# Patient Record
Sex: Female | Born: 1950 | Race: White | Hispanic: No | Marital: Single | Smoking: Former smoker
Health system: Southern US, Community
[De-identification: ages and names within clinical notes are randomized; demographics above are authoritative.]

## PROBLEM LIST (undated history)

## (undated) DIAGNOSIS — Z122 Encounter for screening for malignant neoplasm of respiratory organs: Secondary | ICD-10-CM

## (undated) DIAGNOSIS — E7800 Pure hypercholesterolemia, unspecified: Secondary | ICD-10-CM

## (undated) DIAGNOSIS — F32A Depression, unspecified: Secondary | ICD-10-CM

## (undated) DIAGNOSIS — N870 Mild cervical dysplasia: Secondary | ICD-10-CM

## (undated) DIAGNOSIS — D069 Carcinoma in situ of cervix, unspecified: Secondary | ICD-10-CM

## (undated) DIAGNOSIS — M810 Age-related osteoporosis without current pathological fracture: Secondary | ICD-10-CM

## (undated) DIAGNOSIS — J45909 Unspecified asthma, uncomplicated: Secondary | ICD-10-CM

## (undated) DIAGNOSIS — F329 Major depressive disorder, single episode, unspecified: Secondary | ICD-10-CM

## (undated) DIAGNOSIS — S022XXA Fracture of nasal bones, initial encounter for closed fracture: Secondary | ICD-10-CM

## (undated) DIAGNOSIS — R51 Headache: Secondary | ICD-10-CM

## (undated) HISTORY — PX: GYNECOLOGIC CRYOSURGERY: SHX857

## (undated) HISTORY — DX: Major depressive disorder, single episode, unspecified: F32.9

## (undated) HISTORY — PX: NOSE SURGERY: SHX723

## (undated) HISTORY — DX: Depression, unspecified: F32.A

## (undated) HISTORY — DX: Encounter for screening for malignant neoplasm of respiratory organs: Z12.2

## (undated) HISTORY — PX: COLPOSCOPY: SHX161

## (undated) HISTORY — DX: Carcinoma in situ of cervix, unspecified: D06.9

## (undated) HISTORY — DX: Headache: R51

## (undated) HISTORY — PX: KNEE SURGERY: SHX244

## (undated) HISTORY — PX: CERVICAL BIOPSY  W/ LOOP ELECTRODE EXCISION: SUR135

## (undated) HISTORY — DX: Pure hypercholesterolemia, unspecified: E78.00

## (undated) HISTORY — DX: Mild cervical dysplasia: N87.0

## (undated) HISTORY — PX: BACK SURGERY: SHX140

## (undated) HISTORY — DX: Age-related osteoporosis without current pathological fracture: M81.0

## (undated) HISTORY — PX: OTHER SURGICAL HISTORY: SHX169

---

## 1999-08-25 ENCOUNTER — Encounter: Payer: Self-pay | Admitting: Emergency Medicine

## 1999-08-25 ENCOUNTER — Emergency Department (HOSPITAL_COMMUNITY): Admission: EM | Admit: 1999-08-25 | Discharge: 1999-08-25 | Payer: Self-pay | Admitting: Emergency Medicine

## 2002-02-09 ENCOUNTER — Other Ambulatory Visit: Admission: RE | Admit: 2002-02-09 | Discharge: 2002-02-09 | Payer: Self-pay | Admitting: Obstetrics and Gynecology

## 2002-05-16 ENCOUNTER — Encounter: Payer: Self-pay | Admitting: Family Medicine

## 2003-04-21 ENCOUNTER — Other Ambulatory Visit: Admission: RE | Admit: 2003-04-21 | Discharge: 2003-04-21 | Payer: Self-pay | Admitting: Obstetrics and Gynecology

## 2003-08-01 ENCOUNTER — Emergency Department (HOSPITAL_COMMUNITY): Admission: EM | Admit: 2003-08-01 | Discharge: 2003-08-01 | Payer: Self-pay

## 2003-08-16 ENCOUNTER — Encounter: Payer: Self-pay | Admitting: Family Medicine

## 2004-07-10 ENCOUNTER — Other Ambulatory Visit: Admission: RE | Admit: 2004-07-10 | Discharge: 2004-07-10 | Payer: Self-pay | Admitting: Obstetrics and Gynecology

## 2005-03-04 ENCOUNTER — Ambulatory Visit: Payer: Self-pay | Admitting: Family Medicine

## 2005-03-21 ENCOUNTER — Ambulatory Visit: Payer: Self-pay | Admitting: Family Medicine

## 2005-07-14 ENCOUNTER — Other Ambulatory Visit: Admission: RE | Admit: 2005-07-14 | Discharge: 2005-07-14 | Payer: Self-pay | Admitting: Obstetrics and Gynecology

## 2005-12-19 ENCOUNTER — Emergency Department (HOSPITAL_COMMUNITY): Admission: EM | Admit: 2005-12-19 | Discharge: 2005-12-19 | Payer: Self-pay | Admitting: Emergency Medicine

## 2005-12-19 ENCOUNTER — Ambulatory Visit: Payer: Self-pay | Admitting: Family Medicine

## 2005-12-22 ENCOUNTER — Encounter (HOSPITAL_COMMUNITY): Admission: RE | Admit: 2005-12-22 | Discharge: 2006-03-22 | Payer: Self-pay | Admitting: Family Medicine

## 2005-12-26 ENCOUNTER — Emergency Department (HOSPITAL_COMMUNITY): Admission: EM | Admit: 2005-12-26 | Discharge: 2005-12-26 | Payer: Self-pay | Admitting: Emergency Medicine

## 2006-03-30 ENCOUNTER — Ambulatory Visit: Payer: Self-pay | Admitting: Family Medicine

## 2006-06-04 ENCOUNTER — Ambulatory Visit: Payer: Self-pay | Admitting: Family Medicine

## 2006-08-03 ENCOUNTER — Ambulatory Visit: Payer: Self-pay | Admitting: Family Medicine

## 2006-09-10 ENCOUNTER — Other Ambulatory Visit: Admission: RE | Admit: 2006-09-10 | Discharge: 2006-09-10 | Payer: Self-pay | Admitting: Obstetrics and Gynecology

## 2006-10-12 ENCOUNTER — Ambulatory Visit: Payer: Self-pay | Admitting: Family Medicine

## 2006-10-22 ENCOUNTER — Ambulatory Visit: Payer: Self-pay | Admitting: Family Medicine

## 2006-10-30 ENCOUNTER — Ambulatory Visit: Payer: Self-pay | Admitting: Family Medicine

## 2006-12-03 ENCOUNTER — Encounter: Payer: Self-pay | Admitting: Family Medicine

## 2006-12-26 ENCOUNTER — Ambulatory Visit: Payer: Self-pay | Admitting: Family Medicine

## 2007-04-13 ENCOUNTER — Ambulatory Visit: Payer: Self-pay | Admitting: Family Medicine

## 2007-04-13 LAB — CONVERTED CEMR LAB
ALT: 10 units/L (ref 0–40)
AST: 18 units/L (ref 0–37)
Basophils Relative: 0.5 % (ref 0.0–1.0)
Bilirubin, Direct: 0.1 mg/dL (ref 0.0–0.3)
CO2: 27 meq/L (ref 19–32)
Calcium: 8.8 mg/dL (ref 8.4–10.5)
Eosinophils Relative: 4 % (ref 0.0–5.0)
GFR calc Af Amer: 95 mL/min
Glucose, Bld: 82 mg/dL (ref 70–99)
HCT: 38.2 % (ref 36.0–46.0)
Hemoglobin: 13.2 g/dL (ref 12.0–15.0)
Lymphocytes Relative: 30.6 % (ref 12.0–46.0)
Monocytes Absolute: 0.7 10*3/uL (ref 0.2–0.7)
Neutro Abs: 4.1 10*3/uL (ref 1.4–7.7)
Neutrophils Relative %: 55.1 % (ref 43.0–77.0)
Platelets: 309 10*3/uL (ref 150–400)
Sodium: 138 meq/L (ref 135–145)
Total Protein: 7.1 g/dL (ref 6.0–8.3)
WBC: 7.4 10*3/uL (ref 4.5–10.5)

## 2007-04-26 ENCOUNTER — Ambulatory Visit: Payer: Self-pay | Admitting: Family Medicine

## 2007-07-20 ENCOUNTER — Telehealth: Payer: Self-pay | Admitting: Family Medicine

## 2007-08-24 ENCOUNTER — Telehealth: Payer: Self-pay | Admitting: Family Medicine

## 2007-09-06 ENCOUNTER — Encounter: Payer: Self-pay | Admitting: Family Medicine

## 2007-09-17 ENCOUNTER — Other Ambulatory Visit: Admission: RE | Admit: 2007-09-17 | Discharge: 2007-09-17 | Payer: Self-pay | Admitting: Obstetrics and Gynecology

## 2008-03-07 ENCOUNTER — Telehealth: Payer: Self-pay | Admitting: Family Medicine

## 2008-03-07 ENCOUNTER — Ambulatory Visit: Payer: Self-pay | Admitting: Family Medicine

## 2008-03-07 DIAGNOSIS — Z87898 Personal history of other specified conditions: Secondary | ICD-10-CM | POA: Insufficient documentation

## 2008-03-07 DIAGNOSIS — M545 Low back pain, unspecified: Secondary | ICD-10-CM | POA: Insufficient documentation

## 2008-03-07 DIAGNOSIS — F3289 Other specified depressive episodes: Secondary | ICD-10-CM | POA: Insufficient documentation

## 2008-03-07 DIAGNOSIS — G9332 Myalgic encephalomyelitis/chronic fatigue syndrome: Secondary | ICD-10-CM | POA: Insufficient documentation

## 2008-03-07 DIAGNOSIS — R5382 Chronic fatigue, unspecified: Secondary | ICD-10-CM

## 2008-03-07 DIAGNOSIS — R51 Headache: Secondary | ICD-10-CM

## 2008-03-07 DIAGNOSIS — M810 Age-related osteoporosis without current pathological fracture: Secondary | ICD-10-CM | POA: Insufficient documentation

## 2008-03-07 DIAGNOSIS — F329 Major depressive disorder, single episode, unspecified: Secondary | ICD-10-CM | POA: Insufficient documentation

## 2008-03-07 DIAGNOSIS — R519 Headache, unspecified: Secondary | ICD-10-CM | POA: Insufficient documentation

## 2008-03-07 DIAGNOSIS — J309 Allergic rhinitis, unspecified: Secondary | ICD-10-CM | POA: Insufficient documentation

## 2008-03-07 DIAGNOSIS — J45909 Unspecified asthma, uncomplicated: Secondary | ICD-10-CM | POA: Insufficient documentation

## 2008-03-07 HISTORY — DX: Headache: R51

## 2008-03-13 ENCOUNTER — Encounter: Admission: RE | Admit: 2008-03-13 | Discharge: 2008-03-13 | Payer: Self-pay | Admitting: Family Medicine

## 2008-03-15 ENCOUNTER — Telehealth: Payer: Self-pay | Admitting: Family Medicine

## 2008-03-28 ENCOUNTER — Encounter: Payer: Self-pay | Admitting: Family Medicine

## 2008-04-12 ENCOUNTER — Encounter: Payer: Self-pay | Admitting: Family Medicine

## 2008-05-04 ENCOUNTER — Inpatient Hospital Stay (HOSPITAL_COMMUNITY): Admission: RE | Admit: 2008-05-04 | Discharge: 2008-05-10 | Payer: Self-pay | Admitting: Neurosurgery

## 2008-05-23 ENCOUNTER — Encounter: Payer: Self-pay | Admitting: Family Medicine

## 2008-05-26 ENCOUNTER — Encounter: Payer: Self-pay | Admitting: Internal Medicine

## 2008-06-06 ENCOUNTER — Encounter: Payer: Self-pay | Admitting: Family Medicine

## 2008-06-13 ENCOUNTER — Ambulatory Visit: Payer: Self-pay | Admitting: Family Medicine

## 2008-06-13 DIAGNOSIS — R7309 Other abnormal glucose: Secondary | ICD-10-CM | POA: Insufficient documentation

## 2008-06-14 ENCOUNTER — Other Ambulatory Visit: Admission: RE | Admit: 2008-06-14 | Discharge: 2008-06-14 | Payer: Self-pay | Admitting: Obstetrics and Gynecology

## 2008-06-14 LAB — CONVERTED CEMR LAB
ALT: 15 units/L (ref 0–35)
AST: 29 units/L (ref 0–37)
Alkaline Phosphatase: 50 units/L (ref 39–117)
Basophils Absolute: 0 10*3/uL (ref 0.0–0.1)
Bilirubin, Direct: 0.1 mg/dL (ref 0.0–0.3)
CO2: 26 meq/L (ref 19–32)
Chloride: 102 meq/L (ref 96–112)
Lymphocytes Relative: 32 % (ref 12.0–46.0)
MCHC: 34.4 g/dL (ref 30.0–36.0)
Neutrophils Relative %: 54.3 % (ref 43.0–77.0)
RBC: 4.45 M/uL (ref 3.87–5.11)
RDW: 14.3 % (ref 11.5–14.6)
Sodium: 139 meq/L (ref 135–145)
Total Bilirubin: 0.7 mg/dL (ref 0.3–1.2)

## 2008-06-20 ENCOUNTER — Telehealth: Payer: Self-pay | Admitting: Family Medicine

## 2008-07-04 ENCOUNTER — Ambulatory Visit: Payer: Self-pay | Admitting: Pulmonary Disease

## 2008-07-04 DIAGNOSIS — R0602 Shortness of breath: Secondary | ICD-10-CM | POA: Insufficient documentation

## 2008-07-05 ENCOUNTER — Telehealth (INDEPENDENT_AMBULATORY_CARE_PROVIDER_SITE_OTHER): Payer: Self-pay | Admitting: *Deleted

## 2008-07-27 ENCOUNTER — Telehealth: Payer: Self-pay | Admitting: Family Medicine

## 2008-08-01 ENCOUNTER — Ambulatory Visit: Payer: Self-pay | Admitting: Family Medicine

## 2008-09-15 ENCOUNTER — Ambulatory Visit: Payer: Self-pay | Admitting: Family Medicine

## 2008-09-15 DIAGNOSIS — M546 Pain in thoracic spine: Secondary | ICD-10-CM | POA: Insufficient documentation

## 2008-09-22 ENCOUNTER — Telehealth (INDEPENDENT_AMBULATORY_CARE_PROVIDER_SITE_OTHER): Payer: Self-pay | Admitting: *Deleted

## 2008-10-23 ENCOUNTER — Encounter: Admission: RE | Admit: 2008-10-23 | Discharge: 2008-11-15 | Payer: Self-pay | Admitting: Family Medicine

## 2008-11-20 ENCOUNTER — Ambulatory Visit: Payer: Self-pay | Admitting: Obstetrics and Gynecology

## 2008-11-20 ENCOUNTER — Encounter: Payer: Self-pay | Admitting: Obstetrics and Gynecology

## 2008-11-20 ENCOUNTER — Other Ambulatory Visit: Admission: RE | Admit: 2008-11-20 | Discharge: 2008-11-20 | Payer: Self-pay | Admitting: Obstetrics and Gynecology

## 2008-11-23 ENCOUNTER — Inpatient Hospital Stay (HOSPITAL_COMMUNITY): Admission: EM | Admit: 2008-11-23 | Discharge: 2008-11-24 | Payer: Self-pay | Admitting: Emergency Medicine

## 2008-11-27 ENCOUNTER — Telehealth: Payer: Self-pay | Admitting: Family Medicine

## 2008-11-28 ENCOUNTER — Ambulatory Visit: Payer: Self-pay | Admitting: Family Medicine

## 2008-11-28 DIAGNOSIS — R079 Chest pain, unspecified: Secondary | ICD-10-CM | POA: Insufficient documentation

## 2008-12-26 ENCOUNTER — Ambulatory Visit: Payer: Self-pay | Admitting: Obstetrics and Gynecology

## 2009-01-03 ENCOUNTER — Encounter: Payer: Self-pay | Admitting: Family Medicine

## 2009-02-13 ENCOUNTER — Ambulatory Visit: Payer: Self-pay | Admitting: Family Medicine

## 2009-02-13 DIAGNOSIS — J069 Acute upper respiratory infection, unspecified: Secondary | ICD-10-CM | POA: Insufficient documentation

## 2009-06-19 ENCOUNTER — Encounter: Payer: Self-pay | Admitting: Family Medicine

## 2009-11-28 ENCOUNTER — Ambulatory Visit: Payer: Self-pay | Admitting: Obstetrics and Gynecology

## 2009-11-28 ENCOUNTER — Other Ambulatory Visit: Admission: RE | Admit: 2009-11-28 | Discharge: 2009-11-28 | Payer: Self-pay | Admitting: Obstetrics and Gynecology

## 2010-08-22 ENCOUNTER — Ambulatory Visit: Payer: Self-pay | Admitting: Family Medicine

## 2010-08-23 ENCOUNTER — Encounter: Payer: Self-pay | Admitting: Family Medicine

## 2010-08-26 ENCOUNTER — Ambulatory Visit: Payer: Self-pay | Admitting: Family Medicine

## 2010-08-28 ENCOUNTER — Ambulatory Visit: Payer: Self-pay | Admitting: Family Medicine

## 2010-12-02 ENCOUNTER — Ambulatory Visit: Admit: 2010-12-02 | Payer: Self-pay | Admitting: Obstetrics and Gynecology

## 2010-12-08 ENCOUNTER — Encounter: Payer: Self-pay | Admitting: Family Medicine

## 2010-12-17 NOTE — Assessment & Plan Note (Signed)
Summary: FORM COMPLETION (OK PER DR Rasheema Truluck) // RS   Vital Signs:  Patient profile:   60 year old female Weight:      107 pounds O2 Sat:      96 % Temp:     97.7 degrees F Pulse rate:   80 / minute BP sitting:   120 / 84  (left arm)  Vitals Entered By: Pura Spice, RN (August 22, 2010 9:01 AM) CC: form for vsubstituting teaching .   History of Present Illness: Here for a wellness exam for work. She has applied to work for [employer]. She feels fine with no concerns.   Allergies: 1)  ! Codeine 2)  ! Sulfa 3)  ! Tetracycline  Past History:  Past Medical History: Allergic rhinitis Asthma Depression, sees Dr. Milagros Evener Headache Chronic fatigue syndrome Fibromyalgia Shingles normal cardiac stress test 08-16-03 Osteoporosis  Past Surgical History: Reviewed history from 06/13/2008 and no changes required. Ganglion cyst removed lt wrist right knee arthroscopy twice, 5-08 and 12-08 per Dr. Thurston Hole Lumbar laminectomy at L4-L5 on 05-04-08 per Dr. Tressie Stalker  Family History: Reviewed history from 06/13/2008 and no changes required. Family History High cholesterol Family History Hypertension Family History Lung cancer Family History Diabetes 1st degree relative  Social History: Reviewed history from 03/07/2008 and no changes required. Single Never Smoked Alcohol use-yes Drug use-no  Review of Systems  The patient denies anorexia, fever, weight loss, weight gain, vision loss, decreased hearing, hoarseness, chest pain, syncope, dyspnea on exertion, peripheral edema, prolonged cough, headaches, hemoptysis, abdominal pain, melena, hematochezia, severe indigestion/heartburn, hematuria, incontinence, genital sores, muscle weakness, suspicious skin lesions, transient blindness, difficulty walking, depression, unusual weight change, abnormal bleeding, enlarged lymph nodes, angioedema, breast masses, and testicular masses.         Flu Vaccine Consent Questions      Do you have a history of severe allergic reactions to this vaccine? no    Any prior history of allergic reactions to egg and/or gelatin? no    Do you have a sensitivity to the preservative Thimersol? no    Do you have a past history of Guillan-Barre Syndrome? no    Do you currently have an acute febrile illness? no    Have you ever had a severe reaction to latex? no    Vaccine information given and explained to patient? yes    Are you currently pregnant? no    Lot Number:[id number]   Exp Date:05/17/2011   Site Given  Left Deltoid IM Pura Spice, RN  August 22, 2010 2:19 PM   Physical Exam  General:  Well-developed,well-nourished,in no acute distress; alert,appropriate and cooperative throughout examination Head:  Normocephalic and atraumatic without obvious abnormalities. No apparent alopecia or balding. Eyes:  No corneal or conjunctival inflammation noted. EOMI. Perrla. Funduscopic exam benign, without hemorrhages, exudates or papilledema. Vision grossly normal. Ears:  External ear exam shows no significant lesions or deformities.  Otoscopic examination reveals clear canals, tympanic membranes are intact bilaterally without bulging, retraction, inflammation or discharge. Hearing is grossly normal bilaterally. Nose:  External nasal examination shows no deformity or inflammation. Nasal mucosa are pink and moist without lesions or exudates. Mouth:  Oral mucosa and oropharynx without lesions or exudates.  Teeth in good repair. Neck:  No deformities, masses, or tenderness noted. Chest Wall:  No deformities, masses, or tenderness noted. Lungs:  Normal respiratory effort, chest expands symmetrically. Lungs are clear to auscultation, no crackles or wheezes. Heart:  Normal rate  and regular rhythm. S1 and S2 normal without gallop, murmur, click, rub or other extra sounds. Abdomen:  Bowel sounds positive,abdomen soft and non-tender without masses, organomegaly or hernias noted. Msk:  No  deformity or scoliosis noted of thoracic or lumbar spine.   Pulses:  R and L carotid,radial,femoral,dorsalis pedis and posterior tibial pulses are full and equal bilaterally Extremities:  No clubbing, cyanosis, edema, or deformity noted with normal full range of motion of all joints.   Neurologic:  No cranial nerve deficits noted. Station and gait are normal. Plantar reflexes are down-going bilaterally. DTRs are symmetrical throughout. Sensory, motor and coordinative functions appear intact. Skin:  Intact without suspicious lesions or rashes Cervical Nodes:  No lymphadenopathy noted Axillary Nodes:  No palpable lymphadenopathy Inguinal Nodes:  No significant adenopathy Psych:  Cognition and judgment appear intact. Alert and cooperative with normal attention span and concentration. No apparent delusions, illusions, hallucinations   Impression & Recommendations:  Problem # 1:  HEALTH MAINTENANCE EXAM (ICD-V70.0)  Complete Medication List: 1)  Zoloft 100 Mg Tabs (Sertraline hcl) .Marland Kitchen.. 1 by mouth daily 2)  Proair Hfa 108 (90 Base) Mcg/act Aers (Albuterol sulfate) .... 2 puffs q 4 hours  as needed  Other Orders: Admin 1st Vaccine (16109) Flu Vaccine 7yrs + (60454) Prescriptions: PROAIR HFA 108 (90 BASE) MCG/ACT  AERS (ALBUTEROL SULFATE) 2 puffs q 4 hours  as needed  #1 x 5   Entered and Authorized by:   Nelwyn Salisbury MD   Signed by:   Nelwyn Salisbury MD on 08/22/2010   Method used:   Electronically to        [facility] [address] [facility] [address] [phone]* (retail)       [address]       [place], [place]  [address]       Ph: [phone] or [phone]       Fax: [phone]   RxID:   [id number]    Immunization History:  Tetanus/Td Immunization History:    Tetanus/Td:  historical (11/17/2000)

## 2010-12-17 NOTE — Miscellaneous (Signed)
Summary: immunization update from [facility]  Clinical Lists Changes  Observations: Added new observation of HEPAVAX #2: Historical (02/07/2000 17:08) Added new observation of HEPBVAX#3: Historical (02/07/2000 17:08) Added new observation of HEPBVAX#2: Historical (09/06/1999 17:08) Added new observation of HEPAVAX #1: Historical (08/05/1999 17:08) Added new observation of OPV #1: Historical (08/05/1999 17:08) Added new observation of HEPBVAX#1: Historical (08/05/1999 17:08)      Immunization History:  Hepatitis B Immunization History:    Hepatitis B # 1:  historical (08/05/1999)    Hepatitis B # 2:  historical (09/06/1999)    Hepatitis B # 3:  historical (02/07/2000)  Polio Immunization History:    Polio # 1:  historical (08/05/1999)  Hepatitis A Immunization History:    Hepatitis A # 1:  historical (08/05/1999)    Hepatitis A # 2:  historical (02/07/2000) also pt states had rabies booster at [employer].......gh rn..................Marland Kitchen

## 2010-12-17 NOTE — Miscellaneous (Signed)
Summary: Records from [facility] Dept.  Records from [facility] Dept.   Imported By: Maryln Gottron 08/28/2010 13:04:52  _____________________________________________________________________  External Attachment:    Type:   Image     Comment:   External Document

## 2010-12-17 NOTE — Assessment & Plan Note (Signed)
Summary: tb test/njr  left foreharm/gh  Nurse Visit   Allergies: 1)  ! Codeine 2)  ! Sulfa 3)  ! Tetracycline  Immunizations Administered:  PPD Skin Test:    Vaccine Type: PPD    Site: left forearm    Mfr: Sanofi Pasteur    Dose: 0.1 ml    Route: ID    Given by: Pura Spice, RN    Exp. Date: 09/19/2011    Lot #: [id number]  Orders Added: 1)  TB Skin Test [86580] 2)  Admin 1st Vaccine [14782]

## 2010-12-17 NOTE — Assessment & Plan Note (Signed)
Summary: TB Reading/cb  Nurse Visit   Allergies: 1)  ! Codeine 2)  ! Sulfa 3)  ! Tetracycline  PPD Results    Date of reading: 08/28/2010    Results: < 5mm    Interpretation: negative

## 2010-12-17 NOTE — Letter (Signed)
Summary: Health Examination Certificate  Health Examination Certificate   Imported By: Maryln Gottron 08/30/2010 15:25:41  _____________________________________________________________________  External Attachment:    Type:   Image     Comment:   External Document

## 2011-01-06 ENCOUNTER — Other Ambulatory Visit (HOSPITAL_COMMUNITY)
Admission: RE | Admit: 2011-01-06 | Discharge: 2011-01-06 | Disposition: A | Discharge status: Home / Self Care | Payer: Self-pay | Source: Ambulatory Visit | Attending: Obstetrics and Gynecology | Admitting: Obstetrics and Gynecology

## 2011-01-06 DIAGNOSIS — Z124 Encounter for screening for malignant neoplasm of cervix: Secondary | ICD-10-CM | POA: Insufficient documentation

## 2011-01-07 ENCOUNTER — Encounter (INDEPENDENT_AMBULATORY_CARE_PROVIDER_SITE_OTHER): Payer: BC Managed Care – PPO | Admitting: Obstetrics and Gynecology

## 2011-01-07 ENCOUNTER — Other Ambulatory Visit: Payer: Self-pay | Admitting: Obstetrics and Gynecology

## 2011-01-07 DIAGNOSIS — Z01419 Encounter for gynecological examination (general) (routine) without abnormal findings: Secondary | ICD-10-CM

## 2011-01-07 DIAGNOSIS — R82998 Other abnormal findings in urine: Secondary | ICD-10-CM

## 2011-01-14 ENCOUNTER — Encounter (INDEPENDENT_AMBULATORY_CARE_PROVIDER_SITE_OTHER): Payer: BC Managed Care – PPO

## 2011-01-14 DIAGNOSIS — M810 Age-related osteoporosis without current pathological fracture: Secondary | ICD-10-CM

## 2011-01-23 ENCOUNTER — Ambulatory Visit (INDEPENDENT_AMBULATORY_CARE_PROVIDER_SITE_OTHER): Payer: BC Managed Care – PPO | Admitting: Obstetrics and Gynecology

## 2011-01-23 DIAGNOSIS — M810 Age-related osteoporosis without current pathological fracture: Secondary | ICD-10-CM

## 2011-03-03 LAB — COMPREHENSIVE METABOLIC PANEL
Alkaline Phosphatase: 36 U/L — ABNORMAL LOW (ref 39–117)
BUN: 17 mg/dL (ref 6–23)
Calcium: 8.6 mg/dL (ref 8.4–10.5)
Glucose, Bld: 106 mg/dL — ABNORMAL HIGH (ref 70–99)
Potassium: 3.8 mEq/L (ref 3.5–5.1)
Total Protein: 6 g/dL (ref 6.0–8.3)

## 2011-03-03 LAB — CBC
HCT: 32 % — ABNORMAL LOW (ref 36.0–46.0)
HCT: 32.5 % — ABNORMAL LOW (ref 36.0–46.0)
Hemoglobin: 10.4 g/dL — ABNORMAL LOW (ref 12.0–15.0)
Hemoglobin: 10.7 g/dL — ABNORMAL LOW (ref 12.0–15.0)
MCHC: 32.5 g/dL (ref 30.0–36.0)
MCHC: 32.8 g/dL (ref 30.0–36.0)
Platelets: 278 10*3/uL (ref 150–400)
RDW: 15 % (ref 11.5–15.5)
RDW: 15.1 % (ref 11.5–15.5)

## 2011-03-03 LAB — DIFFERENTIAL
Basophils Relative: 1 % (ref 0–1)
Monocytes Relative: 8 % (ref 3–12)
Neutro Abs: 3.6 10*3/uL (ref 1.7–7.7)
Neutrophils Relative %: 66 % (ref 43–77)

## 2011-03-03 LAB — BASIC METABOLIC PANEL
BUN: 18 mg/dL (ref 6–23)
Creatinine, Ser: 0.87 mg/dL (ref 0.4–1.2)
GFR calc non Af Amer: >60 mL/min (ref >60)
Glucose, Bld: 90 mg/dL (ref 70–99)

## 2011-03-03 LAB — RETICULOCYTES
RBC.: 3.54 MIL/uL — ABNORMAL LOW (ref 3.87–5.11)
Retic Count, Absolute: 38.9 10*3/uL (ref 19.0–186.0)
Retic Ct Pct: 1.1 % (ref 0.4–3.1)

## 2011-03-03 LAB — POCT CARDIAC MARKERS
CKMB, poc: <1 ng/mL — ABNORMAL LOW (ref 1.0–8.0)
Myoglobin, poc: 36 ng/mL (ref 12–200)
Myoglobin, poc: 41.6 ng/mL (ref 12–200)

## 2011-03-03 LAB — LIPID PANEL
HDL: 86 mg/dL (ref >39)
LDL Cholesterol: 71 mg/dL (ref 0–99)
Triglycerides: 121 mg/dL (ref <150)
VLDL: 24 mg/dL (ref 0–40)

## 2011-03-03 LAB — PROTIME-INR: INR: 1 (ref 0.00–1.49)

## 2011-03-03 LAB — CK TOTAL AND CKMB (NOT AT ARMC)
CK, MB: 1.1 ng/mL (ref 0.3–4.0)
Relative Index: INVALID (ref 0.0–2.5)
Total CK: 78 U/L (ref 7–177)

## 2011-03-03 LAB — VITAMIN B12: Vitamin B-12: 296 pg/mL (ref 211–911)

## 2011-03-03 LAB — CARDIAC PANEL(CRET KIN+CKTOT+MB+TROPI)
CK, MB: 1.1 ng/mL (ref 0.3–4.0)
CK, MB: 1.3 ng/mL (ref 0.3–4.0)
Total CK: 86 U/L (ref 7–177)
Troponin I: 0.01 ng/mL (ref 0.00–0.06)

## 2011-03-03 LAB — C-REACTIVE PROTEIN: CRP: 0.4 mg/dL — ABNORMAL LOW (ref <0.6)

## 2011-03-03 LAB — TROPONIN I: Troponin I: 0.02 ng/mL (ref 0.00–0.06)

## 2011-03-03 LAB — APTT: aPTT: 29 seconds (ref 24–37)

## 2011-04-01 NOTE — Op Note (Signed)
Deborah Bray, Deborah Bray                 ACCOUNT NO.:  [id number]   MEDICAL RECORD NO.:  [id number]          PATIENT TYPE:  INP   LOCATION:  3009                         FACILITY:  [facility]   PHYSICIAN:  Cristi Loron, M.D.DATE OF BIRTH:  [birth year]   DATE OF PROCEDURE:  05/04/2008  DATE OF DISCHARGE:                               OPERATIVE REPORT   BRIEF HISTORY:  The patient is a 60 year old white female who has  suffered from back and leg pain.  She has failed medical management, was  worked up with a lumbar MRI which demonstrated the patient had severe  stenosis, degenerative disease, scoliosis, etc at L4-5.  I discussed  various treatment options with the patient including surgery.  She is  happy to proceed with a L4-5 decompression, instrumentation and fusion.   PREOPERATIVE DIAGNOSES:  L4-5 degenerative disease, scoliosis, stenosis,  lumbar radiculopathy such myelopathy, lumbago.   POSTOPERATIVE DIAGNOSES:  L4-5 degenerative disease, scoliosis,  stenosis, lumbar radiculopathy such myelopathy, lumbago.   PROCEDURE:  1. L4 decompressive bilateral laminotomies.  2. Decompressive bilateral L4-L5 nerve roots; L4-5 transforaminal      lumbar interbody fusion with local autograft bone, VITOSS bone      graft extender and Actifuse bone graft extender.  3. Insertion of L4-5 interbody prosthesis (Capstone PEEK interbody      prosthesis); L4-5 posterior segmental instrumentation with legacy      titanium pedicle screws, and rods; L4-5 posterolateral arthrodesis      with local of local autograft bone,VITOSS and Actifuse.   SURGEON:  Cristi Loron, MD.   ASSISTANT:  Danae Orleans. Venetia Maxon, MD   ANESTHESIA:  General endotracheal.   ESTIMATED BLOOD LOSS:  250 mL.   SPECIMENS:  None.   DRAINS:  None.   COMPLICATIONS:  None.   PROCEDURE:  The patient was brought to the operating room by the  Anesthesia Team.  General tracheal anesthesia was induced.  The patient  was then turned to  the prone position on Wilson frame.  The lumbosacral  region was then prepared with Betadine scrub and Betadine solution.  Sterile drapes were applied and then injected the area to be incised  with Marcaine with epinephrine solution using a scalpel to make a linear  midline incision over the L4-5 interspace.  I used electrocautery to  perform a bilateral subperiosteal dissection exposing spinous process  lamina of L4 and L5.  We obtained intraoperative radiograph to confirm  our location and then inserted the first retractor for exposure.   We began the decompression by using high-speed drill performed to  perform bilateral L4 laminotomies.  We widened the laminotomies with  Kerrison punch removing the L4-5 ligamentum flavum.  We then removed the  medial aspect of L4-5 facet joint.  We then performed wide  foraminotomies about the bilateral L4-L5 nerve roots.  The extent of  decompression was in excess it was required to do a lumbar body fusion  secondary to the severe stenosis at the arthropathy at this level.   Having completed the decompression of the bilateral L4 and L5 nerve  roots and  thecal sac, we now turned attention to the arthrodesis.  We  removed the inferior facet of L4 on the right.  This gave [place] wide  lateral exposure of the right L4-5 intervertebral disk.  We performed a  partial intervertebral sac.  We then we incised the L4-5 intervertebral  disk and performed a partial intervertebral dissection with the  pituitary forceps.  We prepared the endplates for fusion by using  curettes to clear soft tissue from the L5 and L5 vertebral endplates.  We then used trial spacers and determined to use a 12 x 26 mm PEEK  interbody prosthesis.  We prefilled this prosthesis with combination of  local autograft bone VITOSS and Actifuse.  We then inserted the  prosthesis into the interspace of course after retracting neural  obstruction structures at harm's way.  I should mention we also  filled  both anterior and posteriorly as well as laterally in the disk space  with local autograft bone VITOSS and Actifuse, which completed the  transforaminal lumbar interbody fusion.   We now turned attention to the instrumentation under fluoroscopic  guidance.  We cannulated the L4-L5 pedicles with bone probe.  We tapped  the pedicles with 5.5 mm tap and then probed inside the tapped pedicles  around the cortical breeches.  We then inserted 6.5 x 50 mm pedicle  screws bilaterally into the L4-L5 pedicles under fluoroscopic guidance.  We then palpated along the medial aspect of bilateral L4-L5 pedicles to  rule out cortical breeches.  We noted that neural nerve roots were not  injured.  We then connected unilateral pedicle screws with a lordotic  rod.  We compressed the construct and then secured the rod in place with  capsules retightened appropriately.  This completed the instrumentation.   We now turned attention to posterolateral arthrodesis.  I used a high-  speed drill to decorticate the remainder of the left L4-5 facet joint in  pars region as well as bilateral transverse processes at L4-5.  We then  laid a combination of local autograft bone VITOSS and Actifuse bone  graft extenders, over these decorticated posterolateral structures.  This completed the posterior arthrodesis.   We then inspected the thecal sac and bilateral L4-L5 nerve roots noted.  They were well decompressed.  We then obtained hemostasis using bipolar  cautery, irrigated the wound out bacitracin solution and removed the  retractor.  We then reapproximated the patient's thoracolumbar fascia  with interrupted #1 Vicryl suture, subcutaneous tissue and interrupted 2-  0 Vicryl suture and the skin with Steri-Strips and Benzoin.  The wound  was then coated with bacitracin ointment.  A sterile dressing applied.  The drapes were removed and the patient was subsequently returned to  supine position where she was  extubated by the Anesthesia Team and  transported to the Post Anesthesia Care Unit in stable condition.  All  sponge, instrument, and needle counts were correct at the end of the  case.      Cristi Loron, M.D.  Electronically Signed     JDJ/MEDQ  D:  05/04/2008  T:  05/05/2008  Job:  [id number]

## 2011-04-01 NOTE — Assessment & Plan Note (Signed)
[facility] OFFICE NOTE   NAME:Bray, Deborah Land                        MRN:          [id number]  DATE:04/13/2007                            DOB:          1951-06-01    This is a 60 year old woman here for presurgical clearance evaluation.  She is scheduled to have arthroscopy to her right knee on May 30 per Dr.  Thurston Hole.  Other than her knee pain, she feels fine and has no complaints  at all.  She does have asthma but it is under very good control lately.  She is off of all of her preventive inhalers.  In effect, has to use an  albuterol rescue inhaler only once or twice a month.   OTHER PAST MEDICAL HISTORY:  She sees Dr. Eda Paschal on a regular basis  for gynecology care.  She has a history of chronic fatigue syndrome,  fibromyalgia, depression, tension headaches, allergies and asthma.  All  of these are under fairly good control.  She sees Dr. Milagros Evener for  psychiatric care on a regular basis.   SURGICAL HISTORY:  Removal of a ganglion cyst from her left wrist.   HABITS:  Drinks some alcohol.  She does not use tobacco.   ALLERGIES:  CODEINE, SULFA AND TETRACYCLINE.   CURRENT MEDICATIONS:  1. Zoloft 150 mg per day.  2. Wellbutrin XL 300 mg per day.  3. Zovia once a day.  4. Albuterol inhaler as needed.   SOCIAL HISTORY:  She is single.  She works for the [employer].   FAMILY HISTORY:  Remarkable for high cholesterol, hypertension and lung  cancer in one grandparent who smoked.   OBJECTIVE:  Height 5 feet 3 inches, weight 138, blood pressure 138/86,  pulse 84 and regular.  GENERAL:  She appears to be quite healthy.  SKIN:  Clear.  EYES:  Clear.  She wears glasses.  EARS:  Clear.  PHARYNX:  Clear.  NECK:  Supple without lymphadenopathy or masses.  LUNGS:  Clear.  CARDIOVASCULAR:  Regular rate and rhythm.  Without murmurs, rubs or  gallops.  Distal pulses are full.  EKG:  Within normal  limits.  ABDOMEN:  Soft, normal bowel sounds, nontender, no masses.  EXTREMITIES:  No clubbing, cyanosis or edema.  NEUROLOGICAL:  Grossly intact.   ASSESSMENT AND PLAN:  1. Presurgical clearance.  She seems to be doing well from an      examination standpoint.  We will send her for some screening      laboratories yet this morning.  Assuming these are within normal      limits, I feel she represents very minimal surgical risk and should      be able to proceed with her planned arthroscopy.  2. Asthma.  Stable.  3. Depression.  Stable.  4. Fibromyalgia.  Stable.     Tera Mater. Clent Ridges, MD  Electronically Signed    SAF/MedQ  DD: 04/14/2007  DT: 04/14/2007  Job #: [id number]

## 2011-04-01 NOTE — Discharge Summary (Signed)
[name], [name]                 ACCOUNT NO.:  [id number]   MEDICAL RECORD NO.:  [id number]          PATIENT TYPE:  INP   LOCATION:  3009                         FACILITY:  [facility]   PHYSICIAN:  Danae Orleans. Venetia Maxon, M.D.  DATE OF BIRTH:  26-May-1951   DATE OF ADMISSION:  05/04/2008  DATE OF DISCHARGE:  05/10/2008                               DISCHARGE SUMMARY   REASON FOR ADMISSION:  1. Lumbar disk herniation with myelopathy.  2. Idiopathic scoliosis.  3. Asthma without status asthmaticus.   HISTORY OF ILLNESS AND HOSPITAL COURSE:  Deborah Bray is a 60 year old  woman with lumbar stenosis at L4-L5 with degenerative disk disease.  She  was admitted to the hospital on same day's procedure basis and underwent  uncomplicated decompression and fusion at the L4-L5 level.  She was  gradually mobilized and was started on oral Percocet and worked with  physical therapy.  She was doing well on the 23rd, was ambulating with  physical therapy and doing well on the 24th.  She was discharged home  with instructions to follow up in 3 weeks with Dr. Lovell Sheehan.   DISCHARGE MEDICATIONS:  Valium, Percocet, and Lyrica.   FINAL DIAGNOSES:  1. Lumbar disk herniation with myelopathy.  2. Idiopathic scoliosis.  3. Asthma without status asthmaticus.   DISCHARGE CONDITION:  Improved.      Danae Orleans. Venetia Maxon, M.D.  Electronically Signed     JDS/MEDQ  D:  06/28/2008  T:  06/29/2008  Job:  [id number]

## 2011-04-04 NOTE — Discharge Summary (Signed)
Deborah Bray, Deborah Bray                 ACCOUNT NO.:  [id number]   MEDICAL RECORD NO.:  [id number]          PATIENT TYPE:  INP   LOCATION:  4730                         FACILITY:  [facility]   PHYSICIAN:  Mohan N. Sharyn Lull, M.D. DATE OF BIRTH:  08-05-1951   DATE OF ADMISSION:  11/23/2008  DATE OF DISCHARGE:  11/24/2008                               DISCHARGE SUMMARY   ADMITTING DIAGNOSES:  1. Chest pain, rule out myocardial infarction.  2. History of bronchial asthma.  3. Depression.  4. Remote history of tobacco abuse.  5. Elevated D-dimer, rule out pulmonary embolism.   DATE OF DISCHARGE:  November 24, 2008.  The patient signed out against  medical advice and refused for any further workup.   CONDITION AT DISCHARGE:  Stable.   BRIEF HISTORY AND HOSPITAL COURSE:  Ms. Pilling is a 60 year old white  female with past medical history significant for bronchial asthma,  history of depression, and remote tobacco abuse.  She came to the ER  complaining of retrosternal squeezing chest pain, grade 10/10, radiating  to the right jaw, lasting approximately 30 minutes, associated with mild  shortness of breath.  She took aspirin and sublingual nitro with relief  of chest pain.  Denies any nausea, vomiting, or diaphoresis.  Denies  palpitation, lightheadedness, or syncope.  Denies PND, orthopnea, or leg  swelling.  States had similar chest pain approximately 2 years ago, but  did not seek any medical attention.  States had stress test in the past  20 years ago which was negative.  Denies relation of chest pain to food,  breathing, or movement.   PAST MEDICAL HISTORY:  As above.   PAST SURGICAL HISTORY:  She had back surgery in the past, had bilateral  knee surgery in the past, and had resection of a skin cancer from the  face in the past.   ALLERGIES:  She is allergic to SULFA, TETRACYCLINE, CODEINE, and  SHELLFISH.   MEDICATION AT HOME:  She is on Zoloft.   SOCIAL HISTORY:  She is single, no  children.  Smoked 3-4 packs per day  for 30+ years, quit 15+ years ago.  Drinks wine daily.  Works for [employer].   FAMILY HISTORY:  Father father died of MI in his 58s.  Mother died of  Parkinson disease.  Three brothers and two sisters in good health.   PHYSICAL EXAMINATION:  GENERAL:  She is alert, awake, oriented x3, in no  acute distress.  VITAL SIGNS:  Blood pressure was 122/70 and pulse was 68 regular.  HEENT:  Conjunctivae pink.  NECK:  Supple.  No JVD.  No bruit.  LUNGS:  Clear to auscultation without rhonchi or rales.  CARDIOVASCULAR:  S1 and S2 was normal.  There was soft systolic murmur.  ABDOMEN:  Soft.  Bowel sounds are present.  Nontender.  EXTREMITIES: There is no clubbing, cyanosis, or edema.   LABORATORY DATA:  Point-of-care, CPK-MB was less than 1.0 x2.  Troponin  I was also less than 0.05.  C-reactive protein was 0.4.  Cholesterol was  181, LDL was 71,  HDL 86, and triglycerides 161.  Two sets of cardiac  enzymes __________ CK was 75, MB 1.1.  Second set CK 86 and MB 1.3.  Troponin I was less than 0.01.  Her homocystine level was 9.8.  D-dimer  was elevated at 1.06.  Hemoglobin was 10.7, hematocrit 32.5, and white  count of 5.4.  Iron was low at 24.  Iron binding capacity was 401.  Saturation was low 6.   BRIEF HOSPITAL COURSE:  The patient was admitted to telemetry unit, was  started on IV heparin and nitrates.  MI was ruled out by serial enzymes  and EKG.  The patient subsequently underwent Persantine Myoview on  November 24, 2008, which showed normal myocardial perfusion with no  evidence of reversible ischemia with EF of 59%.  Due to elevated D-  dimer, the patient was scheduled for spiral CT of the chest which the  patient refused and signed out against medical advice.  The patient  understands that she may have pulmonary embolism which was contributing  to her chest pain.  The patient understands that she may die.  She  leaves against medical advice.  The  patient stated she is feeling  perfectly fine and will follow up as outpatient and signed out against  medical advice.       Eduardo Osier. Sharyn Lull, M.D.  Electronically Signed     MNH/MEDQ  D:  12/27/2008  T:  12/28/2008  Job:  [id number]

## 2011-08-11 ENCOUNTER — Telehealth: Payer: Self-pay | Admitting: Family Medicine

## 2011-08-11 NOTE — Telephone Encounter (Signed)
Pt requesting a script for Allegra-D 24 hr, [place] out [place] and would like to take this with her. Dr. Carnella Gu[place] approved for # 30 and I called into pharmacy.

## 2011-08-12 ENCOUNTER — Telehealth: Payer: Self-pay | Admitting: Family Medicine

## 2011-08-12 MED ORDER — FEXOFENADINE-PSEUDOEPHED ER 180-240 MG PO TB24: 1.0000 | ORAL_TABLET | Freq: Every day | ORAL | Status: AC

## 2011-08-12 MED ORDER — CIPROFLOXACIN HCL 500 MG PO TABS: 500.0000 mg | ORAL_TABLET | Freq: Two times a day (BID) | ORAL | Status: AC

## 2011-08-12 NOTE — Telephone Encounter (Signed)
done

## 2011-08-12 NOTE — Telephone Encounter (Signed)
Pt requesting a script for CIPRO she is leaving out of [place] and would like to take this with her.

## 2011-08-12 NOTE — Telephone Encounter (Signed)
Call in Cipro 500 mg bid, #30 with no rf

## 2011-08-12 NOTE — Telephone Encounter (Signed)
Script called in

## 2011-08-14 LAB — ABO/RH: ABO/RH(D): A POS

## 2011-08-14 LAB — TYPE AND SCREEN
ABO/RH(D): A POS
Antibody Screen: NEGATIVE

## 2011-08-14 LAB — CBC
Hemoglobin: 12.6
Hemoglobin: 11.7 — ABNORMAL LOW
MCHC: 34.6
Platelets: 283
Platelets: 280
RDW: 14.8
RDW: 14.8
WBC: 13 — ABNORMAL HIGH

## 2011-08-14 LAB — BASIC METABOLIC PANEL
Calcium: 8.4
Calcium: 9
GFR calc Af Amer: >60
GFR calc non Af Amer: >60
GFR calc non Af Amer: >60
Glucose, Bld: 74
Glucose, Bld: 96
Potassium: 3.7
Sodium: 135
Sodium: 140

## 2011-08-14 LAB — HCG, SERUM, QUALITATIVE: Preg, Serum: NEGATIVE

## 2012-04-09 ENCOUNTER — Telehealth: Payer: Self-pay | Admitting: Family Medicine

## 2012-04-09 MED ORDER — CIPROFLOXACIN HCL 500 MG PO TABS: 500.0000 mg | ORAL_TABLET | Freq: Two times a day (BID) | ORAL | Status: AC

## 2012-04-09 NOTE — Telephone Encounter (Signed)
Pt is going out of [place] to [place] on 04-19-12 for about 3 wks. Pt is requesting cipro. [facility]

## 2012-04-09 NOTE — Telephone Encounter (Signed)
I called in script and left voice message for pt. 

## 2012-04-09 NOTE — Telephone Encounter (Signed)
Call in Cipro 500 mg bid, #30 with no rf 

## 2012-09-13 ENCOUNTER — Encounter: Payer: Self-pay | Admitting: Gynecology

## 2012-09-13 DIAGNOSIS — N870 Mild cervical dysplasia: Secondary | ICD-10-CM | POA: Insufficient documentation

## 2012-09-13 DIAGNOSIS — M8580 Other specified disorders of bone density and structure, unspecified site: Secondary | ICD-10-CM | POA: Insufficient documentation

## 2012-09-13 DIAGNOSIS — D069 Carcinoma in situ of cervix, unspecified: Secondary | ICD-10-CM | POA: Insufficient documentation

## 2012-09-14 ENCOUNTER — Other Ambulatory Visit (HOSPITAL_COMMUNITY)
Admission: RE | Admit: 2012-09-14 | Discharge: 2012-09-14 | Disposition: A | Discharge status: Home / Self Care | Payer: BC Managed Care – PPO | Source: Ambulatory Visit | Attending: Obstetrics and Gynecology | Admitting: Obstetrics and Gynecology

## 2012-09-14 ENCOUNTER — Encounter: Payer: Self-pay | Admitting: Obstetrics and Gynecology

## 2012-09-14 ENCOUNTER — Ambulatory Visit (INDEPENDENT_AMBULATORY_CARE_PROVIDER_SITE_OTHER): Payer: BC Managed Care – PPO | Admitting: Obstetrics and Gynecology

## 2012-09-14 VITALS — BP 140/90 | Ht 64.0 in | Wt 122.0 lb

## 2012-09-14 DIAGNOSIS — Z01419 Encounter for gynecological examination (general) (routine) without abnormal findings: Secondary | ICD-10-CM

## 2012-09-14 DIAGNOSIS — Z1151 Encounter for screening for human papillomavirus (HPV): Secondary | ICD-10-CM | POA: Insufficient documentation

## 2012-09-14 NOTE — Progress Notes (Signed)
Patient came to see me today for her annual GYN exam. She is postmenopausal not taking HRT. She is doing well without it. She is due for a mammogram. 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 High risk HPV was not detected. She underwent a LEEP. She's had 5 normal Pap smears since then. Her last one was 2012. She has  a history of osteoporosis on bone density. Lab was done for secondary causes of  bone loss. All was  Normal. Patient was told by me to take medication but has declined. We rediscussed today and she still declines. She is having no vaginal bleeding. She is having no pelvic pain. Her last bone density was February, 2012. In 2011 her total cholesterol was 280 with an HDL of 100. Her LDL was 168. She has been trying to work on her diet and she is fasted  Today.  Physical examination:Deborah Bray present. HEENT within normal limits. Neck: Thyroid not large. No masses. Supraclavicular nodes: not enlarged. Breasts: Examined in both sitting and lying  position. No skin changes and no masses. Abdomen: Soft no guarding rebound or masses or hernia. Pelvic: External: Within normal limits. BUS: Within normal limits. Vaginal:within normal limits. Good estrogen effect. No evidence of cystocele rectocele or enterocele. Cervix: clean. Uterus: Normal size and shape. Adnexa: No masses. Rectovaginal exam: Confirmatory and negative. Extremities: Within normal limits.  Assessment: #1. CIN-3 #2. Elevated cholesterol #3. Osteoporosis  Plan: Pap and HPV typing done.The new Pap smear guidelines were discussed with the patient. Mammogram. Once again encouraged to take osteoporosis medication. She declined. Lipid profile done.

## 2012-09-14 NOTE — Patient Instructions (Signed)
Schedule mammogram.

## 2012-09-15 LAB — CBC WITH DIFFERENTIAL/PLATELET
Basophils Relative: 0 % (ref 0–1)
Eosinophils Absolute: 0.5 10*3/uL (ref 0.0–0.7)
Eosinophils Relative: 7 % — ABNORMAL HIGH (ref 0–5)
HCT: 37.9 % (ref 36.0–46.0)
Hemoglobin: 13.2 g/dL (ref 12.0–15.0)
Lymphs Abs: 2 10*3/uL (ref 0.7–4.0)
MCH: 31.3 pg (ref 26.0–34.0)
MCHC: 34.8 g/dL (ref 30.0–36.0)
MCV: 89.8 fL (ref 78.0–100.0)
Monocytes Absolute: 0.7 10*3/uL (ref 0.1–1.0)
Monocytes Relative: 9 % (ref 3–12)
RBC: 4.22 MIL/uL (ref 3.87–5.11)

## 2012-09-15 LAB — URINALYSIS W MICROSCOPIC + REFLEX CULTURE
Bacteria, UA: NONE SEEN
Casts: NONE SEEN
Specific Gravity, Urine: 1.029 (ref 1.005–1.030)
pH: 5.5 (ref 5.0–8.0)

## 2012-09-15 LAB — LIPID PANEL
Cholesterol: 220 mg/dL — ABNORMAL HIGH (ref 0–200)
Triglycerides: 140 mg/dL (ref <150)
VLDL: 28 mg/dL (ref 0–40)

## 2012-09-16 LAB — URINE CULTURE

## 2012-09-20 ENCOUNTER — Encounter: Payer: Self-pay | Admitting: Obstetrics and Gynecology

## 2014-03-21 ENCOUNTER — Telehealth: Payer: Self-pay | Admitting: Family Medicine

## 2014-03-21 DIAGNOSIS — Z85828 Personal history of other malignant neoplasm of skin: Secondary | ICD-10-CM

## 2014-03-21 NOTE — Telephone Encounter (Signed)
Pt called to say that she need a referral for her GYN physical with Dr Sudie Bailey and her dermato Dr Crista Luria

## 2014-03-21 NOTE — Telephone Encounter (Signed)
Referral was done  

## 2014-03-21 NOTE — Telephone Encounter (Signed)
I spoke with pt  

## 2014-03-21 NOTE — Telephone Encounter (Signed)
I spoke with pt and she only needs the referral for dermatology, her insurance requires the new referral. She has a previous history of skin cancer and she does go once a year for a check up. Also she has appointment scheduled for Thursday Mar 23, 2014.

## 2014-03-21 NOTE — Telephone Encounter (Signed)
I left a voice message with the below information.

## 2014-03-21 NOTE — Telephone Encounter (Signed)
We need more info please. She should NOT need a referral for a routine GYN exam. As for the dermatologist, what insurance does the pt have and what does she see them for?

## 2014-03-23 ENCOUNTER — Other Ambulatory Visit: Payer: Self-pay | Admitting: Dermatology

## 2014-04-18 ENCOUNTER — Other Ambulatory Visit: Payer: Self-pay | Admitting: Dermatology

## 2014-09-11 ENCOUNTER — Ambulatory Visit (INDEPENDENT_AMBULATORY_CARE_PROVIDER_SITE_OTHER): Payer: Medicare Other | Admitting: Gynecology

## 2014-09-11 ENCOUNTER — Other Ambulatory Visit (HOSPITAL_COMMUNITY)
Admission: RE | Admit: 2014-09-11 | Discharge: 2014-09-11 | Disposition: A | Discharge status: Home / Self Care | Payer: Medicare Other | Source: Ambulatory Visit | Attending: Gynecology | Admitting: Gynecology

## 2014-09-11 ENCOUNTER — Encounter: Payer: Self-pay | Admitting: Gynecology

## 2014-09-11 VITALS — BP 118/76 | Ht 63.5 in | Wt 116.0 lb

## 2014-09-11 DIAGNOSIS — Z124 Encounter for screening for malignant neoplasm of cervix: Secondary | ICD-10-CM | POA: Insufficient documentation

## 2014-09-11 DIAGNOSIS — C44319 Basal cell carcinoma of skin of other parts of face: Secondary | ICD-10-CM

## 2014-09-11 DIAGNOSIS — N952 Postmenopausal atrophic vaginitis: Secondary | ICD-10-CM

## 2014-09-11 DIAGNOSIS — C44519 Basal cell carcinoma of skin of other part of trunk: Secondary | ICD-10-CM

## 2014-09-11 DIAGNOSIS — Z87410 Personal history of cervical dysplasia: Secondary | ICD-10-CM

## 2014-09-11 DIAGNOSIS — Z23 Encounter for immunization: Secondary | ICD-10-CM

## 2014-09-11 DIAGNOSIS — C4402 Squamous cell carcinoma of skin of lip: Secondary | ICD-10-CM

## 2014-09-11 DIAGNOSIS — M810 Age-related osteoporosis without current pathological fracture: Secondary | ICD-10-CM

## 2014-09-11 NOTE — Patient Instructions (Signed)
Influenza Vaccine (Flu Vaccine, Live, Intranasal) 2014-2015: What You Need to Know 1. Why get vaccinated?  Influenza ("flu") is a contagious disease that spreads around the [place] every winter, usually between October and May. Flu is caused by influenza viruses, and is spread mainly by coughing, sneezing, and close contact. Anyone can get flu, but the risk of getting flu is highest among children. Symptoms come on suddenly and may last several days. They can include:   fever/chills  sore throat  muscle aches  fatigue  cough  headache  runny or stuffy nose Flu can make some people much sicker than others. These people include young children, people 51 and older, pregnant women, and people with certain health conditions-such as heart, lung or kidney disease, nervous system disorders, or a weakened immune system. Flu vaccination is especially important for these people, and anyone in close contact with them. Flu can also lead to pneumonia, and make existing medical conditions worse. It can cause diarrhea and seizures in children. Each year thousands of people in the [place] die from flu, and many more are hospitalized. Flu vaccine is the best protection against flu and its complications. Flu vaccine also helps prevent spreading flu from person to person. 2. Live, attenuated flu vaccine--LAIV, Nasal Spray You are getting a live, attenuated influenza vaccine (called LAIV), which is sprayed into the nose. "Attenuated" means weakened. The viruses in the vaccine have been weakened so they won't give you the flu. There are other "inactivated" and "recombinant" flu vaccines that do not contain live virus. These "flu shots" are given by injection with a needle.  Injectable flu vaccines are described in a separate Vaccine Information Statement. Flu vaccination is recommended every year. Some children 6 months through 59 years of age might need two doses during one year. Flu viruses are  always changing. Each year's flu vaccine is made to protect against viruses that are likely to cause disease that year. LAIV protects against 4 different influenza viruses. Flu vaccine cannot prevent all cases of flu, but it is the best defense against the disease.  It takes about 2 weeks for protection to develop after vaccination, and protection lasts several months to a year. Some illnesses that are not caused by influenza virus are often mistaken for flu. Flu vaccine will not prevent these illnesses. It can only prevent influenza. LAIV may be given to people 2 through 65 years of age. It may safely be given at the same time as other vaccines.  LAIV does not contain thimerosal or other preservatives. 3. Some people should not get this vaccine Tell the person who gives you the vaccine:  If you have any severe, life-threatening allergies, including (for example) an allergy to gelatin or antibiotics. If you ever had a life-threatening allergic reaction after a dose of flu vaccine, or have a severe allergy to any part of this vaccine, you should not get vaccinated.  If you ever had Guillain-Barr Syndrome (a severe paralyzing illness, also called GBS). Some people with a history of GBS should not get this vaccine. This should be discussed with your doctor.  If you have long-term health problems, such as certain heart, breathing, kidney, liver, or nervous system problems, your doctor can help you decide if you should get LAIV.  If you have gotten any other vaccines in the past 4 weeks, or if you are not feeling well. It is usually okay to get flu vaccine when you have a mild illness, but you might  be advised to wait until you feel better. You should come back when you are better.  You should get the flu shot instead of the nasal spray if you:  are pregnant  have a weakened immune system  are allergic to eggs  are a young child with asthma or wheezing problems  are a child or adolescent on  long-term aspirin therapy  will provide care for, or visit someone, within the next 7 days who needs special care for an extremely weakened immune system (ask your health care provider)  have taken influenza antiviral medications in the past 48 hours The person giving you the vaccine can give you more information. 4. Risks of a vaccine reaction With a vaccine, like any medicine, there is a chance of side effects. These are usually mild and go away on their own. Problems that could happen after any vaccine:  Severe allergic reactions from a vaccine are very rare, estimated at less than 1 in a million doses. If one were to occur, it would usually be within a few minutes to a few hours after the vaccination. Mild problems that have been reported following LAIV: Children and adolescents 28-38 years of age:  runny nose, nasal congestion or cough  fever  headache and muscle aches  wheezing  abdominal pain or occasional vomiting or diarrhea Adults 15-73 years of age:  runny nose or nasal congestion  sore throat  cough, chills, tiredness/weakness  headache LAIV is made from weakened virus and does not cause flu. As with any medicine, there is a very remote chance of a vaccine causing a serious injury or death. The safety of vaccines is always being monitored. For more information, visit: http://www.aguilar.org/ 5. What if there is a serious reaction?  What should I look for?  Look for anything that concerns you, such as signs of a severe allergic reaction, very high fever, or behavior changes. Signs of a severe allergic reaction can include hives, swelling of the face and throat, difficulty breathing, a fast heartbeat, dizziness, and weakness. These would start a few minutes to a few hours after the vaccination. What should I do?  If you think it is a severe allergic reaction or other emergency that can't wait, call 9-1-1 and get the person to the nearest hospital. Otherwise, call  your doctor.  Afterward, the reaction should be reported to the Vaccine Adverse Event Reporting System (VAERS). Your doctor should file this report, or you can do it yourself through the VAERS web site at www.vaers.SamedayNews.es, or by calling 901-531-0429. VAERS does not give medical advice. 6. The National Vaccine Injury Compensation Program The [employer] Vaccine Injury Compensation Program (VICP) is a federal program that was created to compensate people who may have been injured by certain vaccines. Persons who believe they may have been injured by a vaccine can learn about the program and about filing a claim by calling 605-361-1153 or visiting the [facility] website at GoldCloset.com.ee. There is a time limit to file a claim for compensation. 7. How can I learn more?   Ask your health care provider.  Call your local or state health department.  Contact the Centers for Disease Control and Prevention (CDC):  Call (331) 075-8840 (1-800-CDC-INFO) or  Visit CDC's website at https://gibson.com/ CDC Vaccine Information Statement (Interim) Live Attenuated Influenza Vaccine (07/05/2013) Document Released: 12/06/2010 Document Revised: 03/20/2014 Document Reviewed: 07/05/2013 [facility] Patient Information 2015 [address]. This information is not intended to replace advice given to you by your health care provider. Make  sure you discuss any questions you have with your health care provider. Shingles Vaccine What You Need to Know WHAT IS SHINGLES?  Shingles is a painful skin rash, often with blisters. It is also called Herpes Zoster or just Zoster.  A shingles rash usually appears on one side of the face or body and lasts from 2 to 4 weeks. Its main symptom is pain, which can be quite severe. Other symptoms of shingles can include fever, headache, chills, and upset stomach. Very rarely, a shingles infection can lead to pneumonia, hearing problems, blindness, brain inflammation  (encephalitis), or death.  For about 1 person in 5, severe pain can continue even after the rash clears up. This is called post-herpetic neuralgia.  Shingles is caused by the Varicella Zoster virus. This is the same virus that causes chickenpox. Only someone who has had a case of chickenpox or rarely, has gotten chickenpox vaccine, can get shingles. The virus stays in your body. It can reappear many years later to cause a case of shingles.  You cannot catch shingles from another person with shingles. However, a person who has never had chickenpox (or chickenpox vaccine) could get chickenpox from someone with shingles. This is not very common.  Shingles is far more common in people 22 and older than in younger people. It is also more common in people whose immune systems are weakened because of a disease such as cancer or drugs such as steroids or chemotherapy.  At least 1 million people get shingles per year in the [place]. SHINGLES VACCINE  A vaccine for shingles was licensed in 6440. In clinical trials, the vaccine reduced the risk of shingles by 50%. It can also reduce the pain in people who still get shingles after being vaccinated.  A single dose of shingles vaccine is recommended for adults 64 years of age and older. SOME PEOPLE SHOULD NOT GET SHINGLES VACCINE OR SHOULD WAIT A person should not get shingles vaccine if he or she:  Has ever had a life-threatening allergic reaction to gelatin, the antibiotic neomycin, or any other component of shingles vaccine. Tell your caregiver if you have any severe allergies.  Has a weakened immune system because of current:  AIDS or another disease that affects the immune system.  Treatment with drugs that affect the immune system, such as prolonged use of high-dose steroids.  Cancer treatment, such as radiation or chemotherapy.  Cancer affecting the bone marrow or lymphatic system, such as leukemia or lymphoma.  Is pregnant, or might be  pregnant. Women should not become pregnant until at least 4 weeks after getting shingles vaccine. Someone with a minor illness, such as a cold, may be vaccinated. Anyone with a moderate or severe acute illness should usually wait until he or she recovers before getting the vaccine. This includes anyone with a temperature of 101.3 F (38 C) or higher. WHAT ARE THE RISKS FROM SHINGLES VACCINE?  A vaccine, like any medicine, could possibly cause serious problems, such as severe allergic reactions. However, the risk of a vaccine causing serious harm, or death, is extremely small.  No serious problems have been identified with shingles vaccine. Mild Problems  Redness, soreness, swelling, or itching at the site of the injection (about 1 person in 3).  Headache (about 1 person in 67). Like all vaccines, shingles vaccine is being closely monitored for unusual or severe problems. WHAT IF THERE IS A MODERATE OR SEVERE REACTION? What should I look for? Any unusual condition, such as  a severe allergic reaction or a high fever. If a severe allergic reaction occurred, it would be within a few minutes to an hour after the shot. Signs of a serious allergic reaction can include difficulty breathing, weakness, hoarseness or wheezing, a fast heartbeat, hives, dizziness, paleness, or swelling of the throat. What should I do?  Call your caregiver, or get the person to a caregiver right away.  Tell the caregiver what happened, the date and time it happened, and when the vaccination was given.  Ask the caregiver to report the reaction by filing a Vaccine Adverse Event Reporting System (VAERS) form. Or, you can file this report through the VAERS web site at www.vaers.SamedayNews.es or by calling (925)737-4573. VAERS does not provide medical advice. HOW CAN I LEARN MORE?  Ask your caregiver. He or she can give you the vaccine package insert or suggest other sources of information.  Contact the Centers for Disease  Control and Prevention (CDC):  Call 626-470-3382 (1-800-CDC-INFO).  Visit the CDC website at http://hunter.com/ CDC Shingles Vaccine VIS (08/22/08) Document Released: 08/31/2006 Document Revised: 01/26/2012 Document Reviewed: 02/23/2013 [facility] Patient Information 2015 [place]. This information is not intended to replace advice given to you by your health care provider. Make sure you discuss any questions you have with your health care provider. Osteoporosis Throughout your life, your body breaks down old bone and replaces it with new bone. As you get older, your body does not replace bone as quickly as it breaks it down. By the age of 46 years, most people begin to gradually lose bone because of the imbalance between bone loss and replacement. Some people lose more bone than others. Bone loss beyond a specified normal degree is considered osteoporosis.  Osteoporosis affects the strength and durability of your bones. The inside of the ends of your bones and your flat bones, like the bones of your pelvis, look like honeycomb, filled with tiny open spaces. As bone loss occurs, your bones become less dense. This means that the open spaces inside your bones become bigger and the walls between these spaces become thinner. This makes your bones weaker. Bones of a person with osteoporosis can become so weak that they can break (fracture) during minor accidents, such as a simple fall. CAUSES  The following factors have been associated with the development of osteoporosis:  Smoking.  Drinking more than 2 alcoholic drinks several days per week.  Long-term use of certain medicines:  Corticosteroids.  Chemotherapy medicines.  Thyroid medicines.  Antiepileptic medicines.  Gonadal hormone suppression medicine.  Immunosuppression medicine.  Being underweight.  Lack of physical activity.  Lack of exposure to the sun. This can lead to vitamin D deficiency.  Certain medical  conditions:  Certain inflammatory bowel diseases, such as Crohn disease and ulcerative colitis.  Diabetes.  Hyperthyroidism.  Hyperparathyroidism. RISK FACTORS Anyone can develop osteoporosis. However, the following factors can increase your risk of developing osteoporosis:  Gender--Women are at higher risk than men.  Age--Being older than 50 years increases your risk.  Ethnicity--White and Asian people have an increased risk.  Weight --Being extremely underweight can increase your risk of osteoporosis.  Family history of osteoporosis--Having a family member who has developed osteoporosis can increase your risk. SYMPTOMS  Usually, people with osteoporosis have no symptoms.  DIAGNOSIS  Signs during a physical exam that may prompt your caregiver to suspect osteoporosis include:  Decreased height. This is usually caused by the compression of the bones that form your spine (vertebrae) because they  have weakened and become fractured.  A curving or rounding of the upper back (kyphosis). To confirm signs of osteoporosis, your caregiver may request a procedure that uses 2 low-dose X-ray beams with different levels of energy to measure your bone mineral density (dual-energy X-ray absorptiometry [DXA]). Also, your caregiver may check your level of vitamin D. TREATMENT  The goal of osteoporosis treatment is to strengthen bones in order to decrease the risk of bone fractures. There are different types of medicines available to help achieve this goal. Some of these medicines work by slowing the processes of bone loss. Some medicines work by increasing bone density. Treatment also involves making sure that your levels of calcium and vitamin D are adequate. PREVENTION  There are things you can do to help prevent osteoporosis. Adequate intake of calcium and vitamin D can help you achieve optimal bone mineral density. Regular exercise can also help, especially resistance and weight-bearing  activities. If you smoke, quitting smoking is an important part of osteoporosis prevention. MAKE SURE YOU:  Understand these instructions.  Will watch your condition.  Will get help right away if you are not doing well or get worse. FOR MORE INFORMATION www.osteo.org and EquipmentWeekly.com.ee Document Released: 08/13/2005 Document Revised: 02/28/2013 Document Reviewed: 10/18/2011 [facility] Patient Information 2015 [place], [place]. This information is not intended to replace advice given to you by your health care provider. Make sure you discuss any questions you have with your health care provider. Bone Densitometry Bone densitometry is a special X-ray that measures your bone density and can be used to help predict your risk of bone fractures. This test is used to determine bone mineral content and density to diagnose osteoporosis. Osteoporosis is the loss of bone that may cause the bone to become weak. Osteoporosis commonly occurs in women entering menopause. However, it may be found in men and in people with other diseases. PREPARATION FOR TEST No preparation necessary. WHO SHOULD BE TESTED?  All women older than 69.  Postmenopausal women (50 to 62) with risk factors for osteoporosis.  People with a previous fracture caused by normal activities.  People with a small body frame (less than 127 poundsor a body mass index [BMI] of less than 21).  People who have a parent with a hip fracture or history of osteoporosis.  People who smoke.  People who have rheumatoid arthritis.  Anyone who engages in excessive alcohol use (more than 3 drinks most days).  Women who experience early menopause. WHEN SHOULD YOU BE RETESTED? Current guidelines suggest that you should wait at least 2 years before doing a bone density test again if your first test was normal.Recent studies indicated that women with normal bone density may be able to wait a few years before needing to repeat a bone density test. You  should discuss this with your caregiver.  NORMAL FINDINGS   Normal: less than standard deviation below normal (greater than -1).  Osteopenia: 1 to 2.5 standard deviations below normal (-1 to -2.5).  Osteoporosis: greater than 2.5 standard deviations below normal (less than -2.5). Test results are reported as a "T score" and a "Z score."The T score is a number that compares your bone density with the bone density of healthy, young women.The Z score is a number that compares your bone density with the scores of women who are the same age, gender, and race.  Ranges for normal findings may vary among different laboratories and hospitals. You should always check with your doctor after having lab work  or other tests done to discuss the meaning of your test results and whether your values are considered within normal limits. MEANING OF TEST  Your caregiver will go over the test results with you and discuss the importance and meaning of your results, as well as treatment options and the need for additional tests if necessary. OBTAINING THE TEST RESULTS It is your responsibility to obtain your test results. Ask the lab or department performing the test when and how you will get your results. Document Released: 11/25/2004 Document Revised: 01/26/2012 Document Reviewed: 12/18/2010 [facility] Patient Information 2015 [place], [place]. This information is not intended to replace advice given to you by your health care provider. Make sure you discuss any questions you have with your health care provider.

## 2014-09-11 NOTE — Progress Notes (Signed)
Deborah Bray [birth year] [id number]   History:    63 y.o. who presented for an overdue GYN exam. The patient had not been seen in the office since 2012. 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 Patient denies any vasomotor symptoms. Patient with no prior mammogram or colonoscopy. Patient refuses colonoscopy. She has not seen her PCP in several years and is seeking to look for a different one.   Bone density 2012 patient indicated that her lowest T score was of the left femoral neck in the osteoporotic range with a value of -2.6 and had been counseled and recommendation made to start on anti-resorptive medication but patient has not done so. Patient was requesting her flu vaccine today. She has not had her shingles vaccine.  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 Tonia Brooms.   Past medical history,surgical history, family history and social history were all reviewed and documented in the EPIC chart.  Gynecologic History No LMP recorded. Patient is postmenopausal. Contraception: post menopausal status Last Pap: Over 3 years ago. Results were: normal Last mammogram: No prior study. Results no prior study  Obstetric History OB History  Gravida Para Term Preterm AB SAB TAB Ectopic Multiple Living  0                  ROS: A ROS was performed and pertinent positives and negatives are included in the history.  GENERAL: No fevers or chills. HEENT: No change in vision, no earache, sore throat or sinus congestion. NECK: No pain or stiffness. CARDIOVASCULAR: No chest pain or pressure. No  palpitations. PULMONARY: No shortness of breath, cough or wheeze. GASTROINTESTINAL: No abdominal pain, nausea, vomiting or diarrhea, melena or bright red blood per rectum. GENITOURINARY: No urinary frequency, urgency, hesitancy or dysuria. MUSCULOSKELETAL: No joint or muscle pain, no back pain, no recent trauma. DERMATOLOGIC: No rash, no itching, no lesions. ENDOCRINE: No polyuria, polydipsia, no heat or cold intolerance. No recent change in weight. HEMATOLOGICAL: No anemia or easy bruising or bleeding. NEUROLOGIC: No headache, seizures, numbness, tingling or weakness. PSYCHIATRIC: No depression, no loss of interest in normal activity or change in sleep pattern.     Exam: chaperone present  BP 118/76  Ht 5' 3.5" (1.613 m)  Wt 116 lb (52.617 kg)  BMI 20.22 kg/m2  Body mass index is 20.22 kg/(m^2).  General appearance : Well developed well nourished female. No acute distress HEENT: Neck supple, trachea midline, no carotid bruits, no thyroidmegaly Lungs: Clear to auscultation, no rhonchi or wheezes, or rib retractions  Heart: Regular rate and rhythm, no murmurs or gallops Breast:Examined in sitting and supine position were symmetrical in appearance, no palpable masses or tenderness,  no skin retraction, no nipple inversion, no nipple discharge, no skin discoloration, no axillary or supraclavicular lymphadenopathy Abdomen: no palpable masses or tenderness, no rebound or guarding Extremities: no edema or skin discoloration or tenderness  Pelvic:  Bartholin, Urethra, Skene Glands: Within normal limits             Vagina: No gross lesions or discharge, atrophic changes  Cervix: No  gross lesions or discharge  Uterus  axial, normal size, shape and consistency, non-tender and mobile  Adnexa  Without masses or tenderness  Anus and perineum  normal   Rectovaginal  normal sphincter tone without palpated masses or tenderness             Hemoccult cards provided     Assessment/Plan:  63 y.o. female  for overdue GYN exam. Patient with issues of compliance. She has refused colonoscopy. I did provide her with requisition to schedule her mammogram. She will schedule a bone density studies urine office in the next couple weeks. Because of her history of osteoporosis and not taking any medication we will need to readdress after her bone density study. We will get a baseline calcium, vitamin D, PTH, BUN/creatinine before recommendations with treatment. Her Pap smear was done today. She was encouraged to begin taking calcium and vitamin D twice a day along with weightbearing exercises 3-4 times a week. I have provided her with a prescription to go to her local pharmacy for her shingles vaccine. She was provided with fecal Hemoccult cards dismissed to the office for testing.   Terrance Mass MD, 5:14 PM 09/11/2014

## 2014-09-12 LAB — BUN: BUN: 21 mg/dL (ref 6–23)

## 2014-09-12 LAB — CREATININE, SERUM: Creat: 0.76 mg/dL (ref 0.50–1.10)

## 2014-09-13 LAB — PTH, INTACT AND CALCIUM
CALCIUM: 9.2 mg/dL (ref 8.4–10.5)
PTH: 36 pg/mL (ref 14–64)

## 2014-09-13 LAB — VITAMIN D 25 HYDROXY (VIT D DEFICIENCY, FRACTURES): Vit D, 25-Hydroxy: 49 ng/mL (ref 30–89)

## 2014-09-14 LAB — CYTOLOGY - PAP

## 2014-09-25 ENCOUNTER — Telehealth: Payer: Self-pay | Admitting: *Deleted

## 2014-09-25 NOTE — Telephone Encounter (Signed)
Pt informed with normal lab result from 09/11/14 OV

## 2015-08-20 ENCOUNTER — Institutional Professional Consult (permissible substitution): Payer: Self-pay | Admitting: Family Medicine

## 2015-08-23 ENCOUNTER — Institutional Professional Consult (permissible substitution): Payer: Self-pay | Admitting: Family Medicine

## 2015-08-28 ENCOUNTER — Encounter: Payer: Self-pay | Admitting: Family Medicine

## 2015-08-28 ENCOUNTER — Ambulatory Visit (INDEPENDENT_AMBULATORY_CARE_PROVIDER_SITE_OTHER): Payer: PPO | Admitting: Family Medicine

## 2015-08-28 VITALS — BP 122/80 | HR 68 | Ht 64.0 in | Wt 117.2 lb

## 2015-08-28 DIAGNOSIS — Z1211 Encounter for screening for malignant neoplasm of colon: Secondary | ICD-10-CM | POA: Diagnosis not present

## 2015-08-28 DIAGNOSIS — M810 Age-related osteoporosis without current pathological fracture: Secondary | ICD-10-CM | POA: Diagnosis not present

## 2015-08-28 DIAGNOSIS — Z1239 Encounter for other screening for malignant neoplasm of breast: Secondary | ICD-10-CM | POA: Diagnosis not present

## 2015-08-28 DIAGNOSIS — R5383 Other fatigue: Secondary | ICD-10-CM | POA: Diagnosis not present

## 2015-08-28 DIAGNOSIS — J4520 Mild intermittent asthma, uncomplicated: Secondary | ICD-10-CM | POA: Diagnosis not present

## 2015-08-28 DIAGNOSIS — Z23 Encounter for immunization: Secondary | ICD-10-CM

## 2015-08-28 NOTE — Patient Instructions (Signed)
   return for fasting lab work.  Call and schedule your mammogram and bone density.  Take the prescription for the tetanus and shingles vaccine to your pharmacist.  You need to take calcium and vitamin D  And continue weightbearing activities.   Osteoporosis Osteoporosis is the thinning and loss of density in the bones. Osteoporosis makes the bones more brittle, fragile, and likely to break (fracture). Over time, osteoporosis can cause the bones to become so weak that they fracture after a simple fall. The bones most likely to fracture are the bones in the hip, wrist, and spine. CAUSES  The exact cause is not known. RISK FACTORS Anyone can develop osteoporosis. You may be at greater risk if you have a family history of the condition or have poor nutrition. You may also have a higher risk if you are:   Female.   13 years old or older.  A smoker.  Not physically active.   White or Asian.  Slender. SIGNS AND SYMPTOMS  A fracture might be the first sign of the disease, especially if it results from a fall or injury that would not usually cause a bone to break. Other signs and symptoms include:   Low back and neck pain.  Stooped posture.  Height loss. DIAGNOSIS  To make a diagnosis, your health care provider may:  Take a medical history.  Perform a physical exam.  Order tests, such as:  A bone mineral density test.  A dual-energy X-ray absorptiometry test. TREATMENT  The goal of osteoporosis treatment is to strengthen your bones to reduce your risk of a fracture. Treatment may involve:  Making lifestyle changes, such as:  Eating a diet rich in calcium.  Doing weight-bearing and muscle-strengthening exercises.  Stopping tobacco use.  Limiting alcohol intake.  Taking medicine to slow the process of bone loss or to increase bone density.  Monitoring your levels of calcium and vitamin D. HOME CARE INSTRUCTIONS  Include calcium and vitamin D in your diet. Calcium is  important for bone health, and vitamin D helps the body absorb calcium.  Perform weight-bearing and muscle-strengthening exercises as directed by your health care provider.  Do not use any tobacco products, including cigarettes, chewing tobacco, and electronic cigarettes. If you need help quitting, ask your health care provider.  Limit your alcohol intake.  Take medicines only as directed by your health care provider.  Keep all follow-up visits as directed by your health care provider. This is important.  Take precautions at home to lower your risk of falling, such as:  Keeping rooms well lit and clutter free.  Installing safety rails on stairs.  Using rubber mats in the bathroom and other areas that are often wet or slippery. SEEK IMMEDIATE MEDICAL CARE IF:  You fall or injure yourself.    This information is not intended to replace advice given to you by your health care provider. Make sure you discuss any questions you have with your health care provider.   Document Released: 08/13/2005 Document Revised: 11/24/2014 Document Reviewed: 04/13/2014 Elsevier Interactive Patient Education [employer].

## 2015-08-28 NOTE — Progress Notes (Signed)
Subjective:    Patient ID: Deborah Bray, female    DOB: 04-28-1951, 64 y.o.   MRN: [id number]  HPI She is new to the practice and here to establish primary care. Reports feeling tired for past 6 months, sleeping a lot, 8-10 hours per night. Has desire to do things but not the energy. Reports history of fatigue in past. No other complaints today.  Denies fever, chills, unexplained weight loss, chest pain, DOE, GI or GU complaints.   Exercises - jogging and walking, boxing video daily Diet: appetite good, not eating as much as she should.    Other providers: Dr. Toy Care is psychiatrist and prescribes her Zoloft Dr. Toney Rakes OB/GYN- recent pap. He ordered DEXA but she did not go.  Dr. Noemi Chapel is Orthopedist Dr. Stefanie Libel- neurosurgeon (back and neck) [place] Dr. Harold Hedge allergies Symbicort. Zyrtec or Allegra Dr. Idolina Primer- eyes. Family history of glaucoma. Complains of watery eyes past 2 months.  Dr. Tonia Brooms- dermatologist- sees regularly History of allergies, environmental grass, pollen. Allergies and cold weather trigger . Uses rescue inhaler 2-3 times per month.    Dexa: 2012- Osteoporosis and did not follow up. She will schedule.  Pap: 2015 and normal. History of abnormal with LEEP Colonoscopy: never had one and refuses. Will do Cologard.  Mammogram: overdue and last one normal. Denies family history.  Immunizations: Tdap never, flu, Shingles- never. Pneumonia- never  She is single, lives alone. Former smoker- 5 ppd for years. Quit 2000.      Review of Systems Review of Systems Constitutional: -fever, -chills, -sweats, -unexpected weight change,+fatigue ENT: -runny nose, -ear pain, -sore throat Cardiology:  -chest pain, -palpitations, -edema Respiratory: -cough, -shortness of breath, -wheezing Gastroenterology: -abdominal pain, -nausea, -vomiting, -diarrhea, -constipation  Hematology: -bleeding or bruising problems Musculoskeletal: -arthralgias, -myalgias, -joint swelling,  -back pain Ophthalmology: -vision changes, wears glasses  Urology: -dysuria, -difficulty urinating, -hematuria, -urinary frequency, -urgency Neurology: -headache, -weakness, -tingling, -numbness       Objective:   Physical Exam BP 122/80 mmHg  Pulse 68  Ht 5\' 4"  (1.626 m)  Wt 117 lb 3.2 oz (53.162 kg)  BMI 20.11 kg/m2  General Appearance:    Alert, cooperative, no distress, appears stated age  Head:    Normocephalic, without obvious abnormality, atraumatic  Eyes:    PERRL, conjunctiva/corneas clear, EOM's intact, fundi    benign  Ears:    Normal TM's and external ear canals  Nose:   Nares normal, mucosa normal, no drainage or sinus   tenderness  Throat:   Lips, mucosa, and tongue normal; teeth and gums normal  Neck:   Supple, no lymphadenopathy;  thyroid:  no   enlargement/tenderness/nodule  Back:    Spine nontender, no curvature, ROM normal, no CVA     tenderness  Lungs:     Clear to auscultation bilaterally without wheezes, rales or     ronchi; respirations unlabored  Chest Wall:    No tenderness or deformity   Heart:    Regular rate and rhythm, S1 and S2 normal, no murmur, rub   or gallop  Breast Exam:    Deferred, states she is getting a mammogram  Abdomen:     Soft, non-tender, nondistended, normoactive bowel sounds,    no masses, no hepatosplenomegaly  Genitalia:    Recent exam and Pap performed at gynecologist.   Rectal:    Refused, will order Cologuard.   Extremities:   No clubbing, cyanosis or edema  Pulses:   2+ and symmetric all extremities  Skin:   Skin color, texture, turgor normal, no rashes or lesions  Lymph nodes:   Cervical, supraclavicular, and axillary nodes normal  Neurologic:   CNII-XII intact, normal strength, sensation and gait; reflexes 2+ and symmetric throughout          Psych:   Normal mood, affect, hygiene and grooming.        Assessment & Plan:  Other fatigue - Plan: CBC with Differential/Platelet, Comprehensive metabolic panel, TSH, Lipid  panel  Screening for colon cancer  Need for influenza vaccination - Plan: Flu vaccine HIGH DOSE PF, CANCELED: Flu Vaccine QUAD 36+ mos IM  Need for pneumococcal vaccination - Plan: Pneumococcal conjugate vaccine 13-valent  Screening for breast cancer - Plan: MM DIGITAL SCREENING BILATERAL  Osteoporosis - Plan: DG Bone Density  Asthma, mild intermittent, uncomplicated  No obvious cause for her fatigue, she will return for fasting blood work. Recommend that she continue eating a healthy well balanced diet and continue exercising. Discussed that she should take Symbicort daily and her allergies medication and let me know if she is needing her rescue inhaler more than 1-2 times per month. She will continue seeing Dr. Toy Care for depression and continue taking Zoloft.  She agreed to doing a cologard test to screen for colon cancer.  Osteoporosis- recommend that she take vitamin D and calcium and continue weight bearing exercises.  DEXA ordered , she is overdue.  Flu shot given today. Prevnar given.  Prescription given for Tdap and Shingles vaccination.  Spent at least 45 minutes in counseling and coordination of care.

## 2015-08-29 ENCOUNTER — Other Ambulatory Visit: Payer: PPO

## 2015-08-29 ENCOUNTER — Other Ambulatory Visit: Payer: Self-pay | Admitting: Family Medicine

## 2015-08-29 DIAGNOSIS — R5383 Other fatigue: Secondary | ICD-10-CM

## 2015-08-29 LAB — COMPREHENSIVE METABOLIC PANEL
ALBUMIN: 4.6 g/dL (ref 3.6–5.1)
ALK PHOS: 75 U/L (ref 33–130)
ALT: 10 U/L (ref 6–29)
AST: 21 U/L (ref 10–35)
BUN: 18 mg/dL (ref 7–25)
CHLORIDE: 101 mmol/L (ref 98–110)
CO2: 26 mmol/L (ref 20–31)
CREATININE: 0.77 mg/dL (ref 0.50–0.99)
Calcium: 9.6 mg/dL (ref 8.6–10.4)
Glucose, Bld: 102 mg/dL — ABNORMAL HIGH (ref 65–99)
Potassium: 4.2 mmol/L (ref 3.5–5.3)
SODIUM: 139 mmol/L (ref 135–146)
TOTAL PROTEIN: 7.2 g/dL (ref 6.1–8.1)
Total Bilirubin: 0.8 mg/dL (ref 0.2–1.2)

## 2015-08-29 LAB — CBC WITH DIFFERENTIAL/PLATELET
BASOS ABS: 0 10*3/uL (ref 0.0–0.1)
Basophils Relative: 0 % (ref 0–1)
Eosinophils Absolute: 0.3 10*3/uL (ref 0.0–0.7)
Eosinophils Relative: 5 % (ref 0–5)
HEMATOCRIT: 40.8 % (ref 36.0–46.0)
HEMOGLOBIN: 13.7 g/dL (ref 12.0–15.0)
LYMPHS ABS: 0.5 10*3/uL — AB (ref 0.7–4.0)
LYMPHS PCT: 10 % — AB (ref 12–46)
MCH: 31 pg (ref 26.0–34.0)
MCHC: 33.6 g/dL (ref 30.0–36.0)
MCV: 92.3 fL (ref 78.0–100.0)
MONOS PCT: 7 % (ref 3–12)
MPV: 10.5 fL (ref 8.6–12.4)
Monocytes Absolute: 0.4 10*3/uL (ref 0.1–1.0)
NEUTROS ABS: 4.1 10*3/uL (ref 1.7–7.7)
NEUTROS PCT: 78 % — AB (ref 43–77)
PLATELETS: 233 10*3/uL (ref 150–400)
RBC: 4.42 MIL/uL (ref 3.87–5.11)
RDW: 14 % (ref 11.5–15.5)
WBC: 5.2 10*3/uL (ref 4.0–10.5)

## 2015-08-29 LAB — LIPID PANEL
CHOL/HDL RATIO: 1.7 ratio (ref <=5.0)
CHOLESTEROL: 269 mg/dL — AB (ref 125–200)
HDL: 157 mg/dL (ref >=46)
LDL Cholesterol: 99 mg/dL (ref <130)
Triglycerides: 67 mg/dL (ref <150)
VLDL: 13 mg/dL (ref <30)

## 2015-08-29 LAB — TSH: TSH: 1.478 u[IU]/mL (ref 0.350–4.500)

## 2015-09-01 LAB — HEMOGLOBIN A1C
HEMOGLOBIN A1C: 5.6 % (ref <5.7)
MEAN PLASMA GLUCOSE: 114 mg/dL (ref <117)

## 2015-11-28 DIAGNOSIS — H35362 Drusen (degenerative) of macula, left eye: Secondary | ICD-10-CM | POA: Diagnosis not present

## 2015-12-07 ENCOUNTER — Other Ambulatory Visit: Payer: Self-pay | Admitting: Family Medicine

## 2015-12-07 ENCOUNTER — Other Ambulatory Visit: Payer: Self-pay

## 2015-12-07 DIAGNOSIS — Z1231 Encounter for screening mammogram for malignant neoplasm of breast: Secondary | ICD-10-CM

## 2015-12-27 ENCOUNTER — Encounter: Payer: Self-pay | Admitting: Family Medicine

## 2015-12-27 ENCOUNTER — Encounter (HOSPITAL_COMMUNITY): Payer: Self-pay

## 2015-12-27 ENCOUNTER — Emergency Department (HOSPITAL_COMMUNITY): Payer: PPO

## 2015-12-27 ENCOUNTER — Ambulatory Visit (INDEPENDENT_AMBULATORY_CARE_PROVIDER_SITE_OTHER): Payer: PPO | Admitting: Family Medicine

## 2015-12-27 ENCOUNTER — Emergency Department (HOSPITAL_COMMUNITY)
Admission: EM | Admit: 2015-12-27 | Discharge: 2015-12-27 | Disposition: A | Discharge status: Home / Self Care | Payer: PPO | Attending: Emergency Medicine | Admitting: Emergency Medicine

## 2015-12-27 VITALS — BP 108/64 | HR 76 | Temp 98.1°F | Wt 113.0 lb

## 2015-12-27 DIAGNOSIS — Z79899 Other long term (current) drug therapy: Secondary | ICD-10-CM | POA: Insufficient documentation

## 2015-12-27 DIAGNOSIS — Z86001 Personal history of in-situ neoplasm of cervix uteri: Secondary | ICD-10-CM | POA: Diagnosis not present

## 2015-12-27 DIAGNOSIS — F1010 Alcohol abuse, uncomplicated: Secondary | ICD-10-CM | POA: Diagnosis not present

## 2015-12-27 DIAGNOSIS — Z8639 Personal history of other endocrine, nutritional and metabolic disease: Secondary | ICD-10-CM | POA: Insufficient documentation

## 2015-12-27 DIAGNOSIS — R1030 Lower abdominal pain, unspecified: Secondary | ICD-10-CM | POA: Diagnosis not present

## 2015-12-27 DIAGNOSIS — Z8739 Personal history of other diseases of the musculoskeletal system and connective tissue: Secondary | ICD-10-CM | POA: Diagnosis not present

## 2015-12-27 DIAGNOSIS — R822 Biliuria: Secondary | ICD-10-CM | POA: Diagnosis not present

## 2015-12-27 DIAGNOSIS — Z87891 Personal history of nicotine dependence: Secondary | ICD-10-CM | POA: Diagnosis not present

## 2015-12-27 DIAGNOSIS — K5732 Diverticulitis of large intestine without perforation or abscess without bleeding: Secondary | ICD-10-CM | POA: Insufficient documentation

## 2015-12-27 DIAGNOSIS — F329 Major depressive disorder, single episode, unspecified: Secondary | ICD-10-CM | POA: Diagnosis not present

## 2015-12-27 DIAGNOSIS — R110 Nausea: Secondary | ICD-10-CM | POA: Diagnosis not present

## 2015-12-27 LAB — POCT URINALYSIS DIPSTICK
Blood, UA: NEGATIVE
Glucose, UA: NEGATIVE
KETONES UA: 0.5
LEUKOCYTES UA: NEGATIVE
NITRITE UA: NEGATIVE
PH UA: 6
PROTEIN UA: 0.15
Spec Grav, UA: 1.025
Urobilinogen, UA: 4

## 2015-12-27 LAB — CBC
HCT: 41.1 % (ref 36.0–46.0)
Hemoglobin: 13.5 g/dL (ref 12.0–15.0)
MCH: 31.4 pg (ref 26.0–34.0)
MCHC: 32.8 g/dL (ref 30.0–36.0)
MCV: 95.6 fL (ref 78.0–100.0)
PLATELETS: 233 10*3/uL (ref 150–400)
RBC: 4.3 MIL/uL (ref 3.87–5.11)
RDW: 12.5 % (ref 11.5–15.5)
WBC: 11 10*3/uL — AB (ref 4.0–10.5)

## 2015-12-27 LAB — URINALYSIS, ROUTINE W REFLEX MICROSCOPIC
GLUCOSE, UA: NEGATIVE mg/dL
HGB URINE DIPSTICK: NEGATIVE
KETONES UR: 15 mg/dL — AB
Nitrite: NEGATIVE
PH: 5.5 (ref 5.0–8.0)
PROTEIN: NEGATIVE mg/dL
Specific Gravity, Urine: 1.026 (ref 1.005–1.030)

## 2015-12-27 LAB — COMPREHENSIVE METABOLIC PANEL
ALT: 17 U/L (ref 14–54)
AST: 24 U/L (ref 15–41)
Albumin: 4.1 g/dL (ref 3.5–5.0)
Alkaline Phosphatase: 68 U/L (ref 38–126)
Anion gap: 11 (ref 5–15)
BUN: 18 mg/dL (ref 6–20)
CHLORIDE: 105 mmol/L (ref 101–111)
CO2: 27 mmol/L (ref 22–32)
CREATININE: 0.84 mg/dL (ref 0.44–1.00)
Calcium: 10 mg/dL (ref 8.9–10.3)
GFR calc Af Amer: >60 mL/min (ref >60)
Glucose, Bld: 127 mg/dL — ABNORMAL HIGH (ref 65–99)
Potassium: 4.1 mmol/L (ref 3.5–5.1)
Sodium: 143 mmol/L (ref 135–145)
Total Bilirubin: 1.1 mg/dL (ref 0.3–1.2)
Total Protein: 7.6 g/dL (ref 6.5–8.1)

## 2015-12-27 LAB — HEMOCCULT GUIAC POC 1CARD (OFFICE): FECAL OCCULT BLD: NEGATIVE

## 2015-12-27 LAB — URINE MICROSCOPIC-ADD ON

## 2015-12-27 LAB — LIPASE, BLOOD: LIPASE: 30 U/L (ref 11–51)

## 2015-12-27 MED ORDER — CIPROFLOXACIN HCL 500 MG PO TABS
500.0000 mg | ORAL_TABLET | Freq: Once | ORAL | Status: AC
  Administered 2015-12-27: 500 mg via ORAL
  Filled 2015-12-27: qty 1

## 2015-12-27 MED ORDER — CIPROFLOXACIN HCL 500 MG PO TABS: 500.0000 mg | ORAL_TABLET | Freq: Two times a day (BID) | ORAL | Status: DC

## 2015-12-27 MED ORDER — SODIUM CHLORIDE 0.9 % IV BOLUS (SEPSIS)
1000.0000 mL | Freq: Once | INTRAVENOUS | Status: AC
  Administered 2015-12-27: 1000 mL via INTRAVENOUS

## 2015-12-27 MED ORDER — ONDANSETRON 4 MG PO TBDP: 4.0000 mg | ORAL_TABLET | Freq: Three times a day (TID) | ORAL | Status: DC | PRN

## 2015-12-27 MED ORDER — METRONIDAZOLE 500 MG PO TABS: 500.0000 mg | ORAL_TABLET | Freq: Three times a day (TID) | ORAL | Status: DC

## 2015-12-27 MED ORDER — IOHEXOL 300 MG/ML  SOLN
50.0000 mL | Freq: Once | INTRAMUSCULAR | Status: AC | PRN
  Administered 2015-12-27: 50 mL via ORAL

## 2015-12-27 MED ORDER — IOHEXOL 300 MG/ML  SOLN
100.0000 mL | Freq: Once | INTRAMUSCULAR | Status: AC | PRN
  Administered 2015-12-27: 100 mL via INTRAVENOUS

## 2015-12-27 MED ORDER — METRONIDAZOLE 500 MG PO TABS
500.0000 mg | ORAL_TABLET | Freq: Once | ORAL | Status: AC
  Administered 2015-12-27: 500 mg via ORAL
  Filled 2015-12-27: qty 1

## 2015-12-27 NOTE — ED Notes (Signed)
Patient d/c'd self care.  F/U and medications discussed.  Patient verbalized understanding. 

## 2015-12-27 NOTE — Patient Instructions (Signed)
Straight to the emergency room at [facility] for further evaluation and possible CAT scan of your abdomen.

## 2015-12-27 NOTE — Discharge Instructions (Signed)
Your CT scan showed evidence of diverticulitis. Your bladder also looked mildly inflamed but since you are not having urinary symptoms we will send your urine for culture and call if you if there is significant growth. I will give you prescriptions for two antibiotics for the diverticulitis. Please follow up with your primary care provider within one week. I will also give you the contact info for GI for follow up as needed. Return to the ER for new or worsening symptoms.   Diverticulitis Diverticulitis is inflammation or infection of small pouches in your colon that form when you have a condition called diverticulosis. The pouches in your colon are called diverticula. Your colon, or large intestine, is where water is absorbed and stool is formed. Complications of diverticulitis can include:  Bleeding.  Severe infection.  Severe pain.  Perforation of your colon.  Obstruction of your colon. CAUSES  Diverticulitis is caused by bacteria. Diverticulitis happens when stool becomes trapped in diverticula. This allows bacteria to grow in the diverticula, which can lead to inflammation and infection. RISK FACTORS People with diverticulosis are at risk for diverticulitis. Eating a diet that does not include enough fiber from fruits and vegetables may make diverticulitis more likely to develop. SYMPTOMS  Symptoms of diverticulitis may include:  Abdominal pain and tenderness. The pain is normally located on the left side of the abdomen, but may occur in other areas.  Fever and chills.  Bloating.  Cramping.  Nausea.  Vomiting.  Constipation.  Diarrhea.  Blood in your stool. DIAGNOSIS  Your health care provider will ask you about your medical history and do a physical exam. You may need to have tests done because many medical conditions can cause the same symptoms as diverticulitis. Tests may include:  Blood tests.  Urine tests.  Imaging tests of the abdomen, including X-rays and  CT scans. When your condition is under control, your health care provider may recommend that you have a colonoscopy. A colonoscopy can show how severe your diverticula are and whether something else is causing your symptoms. TREATMENT  Most cases of diverticulitis are mild and can be treated at home. Treatment may include:  Taking over-the-counter pain medicines.  Following a clear liquid diet.  Taking antibiotic medicines by mouth for 7-10 days. More severe cases may be treated at a hospital. Treatment may include:  Not eating or drinking.  Taking prescription pain medicine.  Receiving antibiotic medicines through an IV tube.  Receiving fluids and nutrition through an IV tube.  Surgery. HOME CARE INSTRUCTIONS   Follow your health care provider's instructions carefully.  Follow a full liquid diet or other diet as directed by your health care provider. After your symptoms improve, your health care provider may tell you to change your diet. He or she may recommend you eat a high-fiber diet. Fruits and vegetables are good sources of fiber. Fiber makes it easier to pass stool.  Take fiber supplements or probiotics as directed by your health care provider.  Only take medicines as directed by your health care provider.  Keep all your follow-up appointments. SEEK MEDICAL CARE IF:   Your pain does not improve.  You have a hard time eating food.  Your bowel movements do not return to normal. SEEK IMMEDIATE MEDICAL CARE IF:   Your pain becomes worse.  Your symptoms do not get better.  Your symptoms suddenly get worse.  You have a fever.  You have repeated vomiting.  You have bloody or black, tarry stools. MAKE  SURE YOU:   Understand these instructions.  Will watch your condition.  Will get help right away if you are not doing well or get worse.   This information is not intended to replace advice given to you by your health care provider. Make sure you discuss any  questions you have with your health care provider.   Document Released: 08/13/2005 Document Revised: 11/08/2013 Document Reviewed: 09/28/2013 Elsevier Interactive Patient Education [employer].

## 2015-12-27 NOTE — ED Notes (Signed)
Patient presents to the ED with abdominal pain. Patient was seen at PCP today for this pain and was sent here for further evaluation. Patient reports lower abdominal pain that started 2 days ago.

## 2015-12-27 NOTE — ED Provider Notes (Signed)
CSN: [id number]     Arrival date & time 12/27/15  1613 History   First MD Initiated Contact with Patient 12/27/15 1926     Chief Complaint  Patient presents with  . Abdominal Pain    HPI   Deborah Bray is an 65 y.o. female with history of CIN, HLD who presents to the ED for evaluation of lower abdominal pain. She states she first started feeling "funny" in her lower abdomen about one day ago. However, last night the pain acutely worsened, particularly in her suprapubic region. She states that the pain came in waves and felt like sharp excruciating cramps. She states that the pain last night was unbearable. However, this morning she woke up and the pain resolved. She states that over the course of the day the pain has come back, though not as bad. Rates it currently 4/10. She has not tried anything for her symptoms. Denies N/V. States she had been constipated for the past few days but then this morning had two episodes of watery diarrhea. Denies fever or chills. Denies dysuria, urinary frequency/urgency, vaginal complaints. States she saw her PCP today who sent her to the ED for CT scan.   Past Medical History  Diagnosis Date  . CIN III (cervical intraepithelial neoplasia III)   . CIN I (cervical intraepithelial neoplasia I)   . Osteopenia   . Elevated cholesterol   . Depression    Past Surgical History  Procedure Laterality Date  . Cervical biopsy  w/ loop electrode excision    . Colposcopy    . Nose surgery    . Vocal cord surg    . Knee surgery      Arthroscopic X 3  . Squamous cell excised      Lip  . Back surgery    . Gynecologic cryosurgery     Family History  Problem Relation Age of Onset  . Heart disease Mother   . Hypertension Mother   . Diabetes Mother   . Stroke Mother   . Allergies Mother   . Bowel Disease Mother   . Glaucoma Mother   . Other Sister     Brain tumor-benign  . Cancer Sister     BRAIN TUMOR  . Cancer Brother     Throat cancer  . Parkinson's disease  Father    Social History  Substance Use Topics  . Smoking status: Former Smoker    Quit date: 08/28/1999  . Smokeless tobacco: None  . Alcohol Use: 3.5 oz/week    7 Standard drinks or equivalent per week     Comment: 2 glasses of wine daily   OB History    Gravida Para Term Preterm AB TAB SAB Ectopic Multiple Living   0              Review of Systems  All other systems reviewed and are negative.     Allergies  Bee venom; Codeine; Shellfish allergy; Sulfonamide derivatives; and Tetracycline  Home Medications   Prior to Admission medications   Medication Sig Start Date End Date Taking? Authorizing Provider  budesonide-formoterol (SYMBICORT) 80-4.5 MCG/ACT inhaler 2 puffs in am and 2 puffs in evening as needed with asthma flare up    Historical Provider, MD  Ibuprofen (ADVIL PO) Take by mouth.    Historical Provider, MD  sertraline (ZOLOFT) 50 MG tablet Take 50 mg by mouth daily. Take 2 pills at daily    Historical Provider, MD   BP 126/91 mmHg  Pulse  83  Temp(Src) 98.2 F (36.8 C) (Oral)  Resp 16  SpO2 99% Physical Exam  Constitutional: She is oriented to person, place, and time.  HENT:  Right Ear: External ear normal.  Left Ear: External ear normal.  Nose: Nose normal.  Mouth/Throat: Oropharynx is clear and moist. No oropharyngeal exudate.  Eyes: Conjunctivae and EOM are normal. Pupils are equal, round, and reactive to light.  Neck: Normal range of motion. Neck supple.  Cardiovascular: Normal rate, regular rhythm, normal heart sounds and intact distal pulses.   Pulmonary/Chest: Effort normal and breath sounds normal. No respiratory distress. She has no wheezes. She exhibits no tenderness.  Abdominal: Soft. Bowel sounds are normal. She exhibits no distension. There is tenderness in the suprapubic area. There is no rigidity, no rebound, no guarding and no CVA tenderness.  Musculoskeletal: She exhibits no edema.  Neurological: She is alert and oriented to person,  place, and time. No cranial nerve deficit.  Skin: Skin is warm and dry.  Psychiatric: She has a normal mood and affect.  Nursing note and vitals reviewed.  Filed Vitals:   12/27/15 1642 12/27/15 1900 12/27/15 2111  BP: 116/88 126/91 125/78  Pulse: 77 83 80  Temp: 98.2 F (36.8 C)  98.7 F (37.1 C)  TempSrc: Oral  Oral  Resp: 18 16 16   SpO2: 100% 99% 99%     ED Course  Procedures (including critical care time) Labs Review Labs Reviewed  COMPREHENSIVE METABOLIC PANEL - Abnormal; Notable for the following:    Glucose, Bld 127 (*)    All other components within normal limits  CBC - Abnormal; Notable for the following:    WBC 11.0 (*)    All other components within normal limits  URINALYSIS, ROUTINE W REFLEX MICROSCOPIC (NOT AT [facility]) - Abnormal; Notable for the following:    Color, Urine AMBER (*)    Bilirubin Urine LARGE (*)    Ketones, ur 15 (*)    Leukocytes, UA TRACE (*)    All other components within normal limits  URINE MICROSCOPIC-ADD ON - Abnormal; Notable for the following:    Squamous Epithelial / LPF 0-5 (*)    Bacteria, UA RARE (*)    All other components within normal limits  URINE CULTURE  LIPASE, BLOOD    Imaging Review Ct Abdomen Pelvis W Contrast  12/27/2015  CLINICAL DATA:  65 year old female with abdominal pain, nausea x2 days. Loose stools this morning. EXAM: CT ABDOMEN AND PELVIS WITH CONTRAST TECHNIQUE: Multidetector CT imaging of the abdomen and pelvis was performed using the standard protocol following bolus administration of intravenous contrast. CONTRAST:  177mL OMNIPAQUE IOHEXOL 300 MG/ML SOLN, 73mL OMNIPAQUE IOHEXOL 300 MG/ML SOLN COMPARISON:  None. FINDINGS: The visualized lung bases are clear. No intra-abdominal free air. Trace free fluid noted within the pelvis. A 1.5 cm left hepatic hypodense lesion is not well characterized but may represent a cyst or hemangioma. The gallbladder, pancreas, spleen, and adrenal glands appear unremarkable. The  kidneys, visualized ureters appear unremarkable. The urinary bladder is predominantly collapsed. There is diffuse thickening of the bladder wall which may be reactive to inflammatory changes of the sigmoid colon. Correlation with urinalysis recommended to exclude cystitis. Hysterectomy. There is sigmoid diverticulosis. There is segmental the an inflammatory changes of the sigmoid colon compatible with diverticulitis. An area of low attenuation along the wall of the sigmoid colon likely represents wall edema or fluid adjacent to the sigmoid. No drainable fluid collection/abscess identified. The 1.8 cm sigmoid diverticulum noted with  possible fecal impaction. Evaluation of the sigmoid colon is limited due to non opacification with oral contrast. There is telescoping of two loops of small bowel in the left hemiabdomen likely representing transient intussusception. There is no evidence of bowel obstruction. Oral contrast noted within the distal small bowel. Normal appendix. The abdominal aorta is tortuous. No portal venous gas identified. There is no adenopathy. The abdominal wall soft tissues appear unremarkable. L4-L5 posterior fixation screws noted. No acute fracture. IMPRESSION: Extensive sigmoid diverticulitis. No definite drainable fluid collection/ abscess identified. Intussusception of the small bowel in the left hemi abdomen, likely transient. No evidence of bowel obstruction. Electronically Signed   By: Anner Crete M.D.   On: 12/27/2015 21:10   I have personally reviewed and evaluated these images and lab results as part of my medical decision-making.   EKG Interpretation None      MDM   Final diagnoses:  Diverticulitis of large intestine without perforation or abscess without bleeding  Lower abdominal pain    CT reveals extensive sigmoid diverticulitis. There is intussusception which is thought to be transient. No e/o SBO. There is thickening of the bladder which might represent possible  cystitis. However, pt denies urinary symptoms at this time. Will send urine for culture. Will treat outpatient diverticulitis with cipro/flagyl. Will give rx for anti-emetics as well. Instructed to f/u with PCP. Referral to GI given as well. At this time pt reports she feels well. VSS. No emesis in the ED. Will d/c home with ER return precautions.      Anne Ng, PA-C 12/28/15 [id number]  Leonard Schwartz, MD 12/29/15 1041

## 2015-12-27 NOTE — Progress Notes (Addendum)
Subjective:    Patient ID: Deborah Bray, female    DOB: [birth year], 65 y.o.   MRN: [id number]  HPI Chief Complaint  Patient presents with  . Acute Visit    cramping below the navel area pain scale #11   She is here with complaints of 2 day history lower abdominal pain that felt like spasms, "charlie horse". Pain started 2 days ago while doing nothing. At the worst her pain was 10/10 and now it is a 4/10. Pain does not appear to be related to food or activity. She took an X-lax yesterday because she thought she might be constipation. She took a muscle relaxant this morning and this helped a lot. Pain returning now that medication has worn off.  Last bowel movement 3 days ago. Reports having brown watery stools since last night. States she had 2 episodes of this and the last one was 10 am this morning. She has not been able ot pass gas since yesterday. Denies blood or pus in stool. States nothing seems to help or aggravate the pain. Also complains of nausea without vomiting and low back pain.   Denies urinary frequency, dysuria, odor. Denies vaginal discharge. Last pap was last year per patient.    Drinks wine 2-3 glasses per day. Former smoker.   Denies history of abdominal or pelvic surgery.  She has never had a colonoscopy. She agreed to do cologuard and has this at home.  Scheduled a mammogram and bone density for later this month.     Review of Systems Pertinent positives and negatives in the history of present illness.     Objective:   Physical Exam  Constitutional: She is oriented to person, place, and time. She appears well-developed and well-nourished. She has a sickly appearance. No distress.  Eyes: No scleral icterus.  Cardiovascular: Normal rate, regular rhythm, normal heart sounds and normal pulses.   Pulmonary/Chest: Effort normal and breath sounds normal.  Abdominal: Soft. Normal appearance. Bowel sounds are decreased. There is no hepatosplenomegaly. There is  tenderness. There is no rigidity, no rebound and no CVA tenderness.    Marked tenderness to lower abdomen, worse to LLQ  Genitourinary: Rectum normal. Guaiac negative stool. There is no tenderness on the right labia. There is no tenderness on the left labia. There is tenderness in the vagina.  Unable to fully insert speculum due to patient discomfort. Manual exam does not reveal any obvious masses but marked tenderness noted  Neurological: She is alert and oriented to person, place, and time.  Skin: Skin is warm and dry. No rash noted. No cyanosis. No pallor. Nails show no clubbing.  Psychiatric: She has a normal mood and affect. Her speech is normal and behavior is normal. Judgment and thought content normal. Cognition and memory are normal.   BP 108/64 mmHg  Pulse 76  Wt 113 lb (51.256 kg)     Assessment & Plan:  Lower abdominal pain - Plan: POCT urinalysis dipstick  Nausea without vomiting  Alcohol abuse  Bilirubin in urine  Discussed that her symptoms are suspicious for possible bowel obstruction versus diverticulitis. She does not appear infectious but based on abdominal exam I'm concerned about some sort of acute process. Pelvic and rectal exam did not reveal anything obvious but unable to do a complete pelvic exam due to patient's discomfort. Discussed patient with Dr. Redmond School and he and I agree that at this point it would be in patient's best interest to send her to the ED  for further evaluation and possible CT of her abdomen. Patient requesting to go to [facility] emergency department. I called and spoke with the triage nurse regarding patient's soon arrival.

## 2015-12-27 NOTE — ED Notes (Signed)
Pt, being sent by PCP, c/o abdominal pain and nausea x 2 days and loose stool this morning.  Provider reports lower abdominal tenderness.  Sts Pt has been unable to pass gas.

## 2015-12-29 LAB — URINE CULTURE: Culture: NO GROWTH

## 2016-01-09 ENCOUNTER — Inpatient Hospital Stay: Admission: RE | Admit: 2016-01-09 | Payer: Self-pay | Source: Ambulatory Visit

## 2016-01-09 ENCOUNTER — Ambulatory Visit: Payer: Self-pay

## 2016-01-15 DIAGNOSIS — J3081 Allergic rhinitis due to animal (cat) (dog) hair and dander: Secondary | ICD-10-CM | POA: Diagnosis not present

## 2016-01-15 DIAGNOSIS — J4540 Moderate persistent asthma, uncomplicated: Secondary | ICD-10-CM | POA: Diagnosis not present

## 2016-01-15 DIAGNOSIS — J301 Allergic rhinitis due to pollen: Secondary | ICD-10-CM | POA: Diagnosis not present

## 2016-01-15 DIAGNOSIS — J3089 Other allergic rhinitis: Secondary | ICD-10-CM | POA: Diagnosis not present

## 2016-01-30 ENCOUNTER — Ambulatory Visit: Payer: Self-pay

## 2016-01-30 ENCOUNTER — Other Ambulatory Visit: Payer: Self-pay

## 2016-04-07 DIAGNOSIS — M25561 Pain in right knee: Secondary | ICD-10-CM | POA: Diagnosis not present

## 2016-06-09 DIAGNOSIS — Z85828 Personal history of other malignant neoplasm of skin: Secondary | ICD-10-CM | POA: Diagnosis not present

## 2016-06-09 DIAGNOSIS — D485 Neoplasm of uncertain behavior of skin: Secondary | ICD-10-CM | POA: Diagnosis not present

## 2016-06-09 DIAGNOSIS — D225 Melanocytic nevi of trunk: Secondary | ICD-10-CM | POA: Diagnosis not present

## 2016-06-09 DIAGNOSIS — L821 Other seborrheic keratosis: Secondary | ICD-10-CM | POA: Diagnosis not present

## 2016-06-09 DIAGNOSIS — L820 Inflamed seborrheic keratosis: Secondary | ICD-10-CM | POA: Diagnosis not present

## 2016-06-09 DIAGNOSIS — Z87898 Personal history of other specified conditions: Secondary | ICD-10-CM | POA: Diagnosis not present

## 2016-06-09 DIAGNOSIS — L858 Other specified epidermal thickening: Secondary | ICD-10-CM | POA: Diagnosis not present

## 2016-07-15 DIAGNOSIS — J301 Allergic rhinitis due to pollen: Secondary | ICD-10-CM | POA: Diagnosis not present

## 2016-07-15 DIAGNOSIS — J4540 Moderate persistent asthma, uncomplicated: Secondary | ICD-10-CM | POA: Diagnosis not present

## 2016-07-15 DIAGNOSIS — J3089 Other allergic rhinitis: Secondary | ICD-10-CM | POA: Diagnosis not present

## 2016-07-15 DIAGNOSIS — J3081 Allergic rhinitis due to animal (cat) (dog) hair and dander: Secondary | ICD-10-CM | POA: Diagnosis not present

## 2016-09-01 DIAGNOSIS — B309 Viral conjunctivitis, unspecified: Secondary | ICD-10-CM | POA: Diagnosis not present

## 2016-09-12 ENCOUNTER — Ambulatory Visit
Admission: RE | Admit: 2016-09-12 | Discharge: 2016-09-12 | Disposition: A | Discharge status: Home / Self Care | Payer: PPO | Source: Ambulatory Visit | Attending: Family Medicine | Admitting: Family Medicine

## 2016-09-12 ENCOUNTER — Encounter: Payer: Self-pay | Admitting: Family Medicine

## 2016-09-12 ENCOUNTER — Ambulatory Visit (INDEPENDENT_AMBULATORY_CARE_PROVIDER_SITE_OTHER): Payer: PPO | Admitting: Family Medicine

## 2016-09-12 DIAGNOSIS — S069X9A Unspecified intracranial injury with loss of consciousness of unspecified duration, initial encounter: Secondary | ICD-10-CM

## 2016-09-12 DIAGNOSIS — M545 Low back pain, unspecified: Secondary | ICD-10-CM

## 2016-09-12 DIAGNOSIS — M542 Cervicalgia: Secondary | ICD-10-CM

## 2016-09-12 DIAGNOSIS — S0083XA Contusion of other part of head, initial encounter: Secondary | ICD-10-CM | POA: Diagnosis not present

## 2016-09-12 DIAGNOSIS — M25562 Pain in left knee: Secondary | ICD-10-CM

## 2016-09-12 DIAGNOSIS — S022XXA Fracture of nasal bones, initial encounter for closed fracture: Secondary | ICD-10-CM | POA: Diagnosis not present

## 2016-09-12 DIAGNOSIS — S0992XA Unspecified injury of nose, initial encounter: Secondary | ICD-10-CM

## 2016-09-12 MED ORDER — CYCLOBENZAPRINE HCL 10 MG PO TABS: 10.0000 mg | ORAL_TABLET | Freq: Three times a day (TID) | ORAL | 0 refills | Status: DC | PRN
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
## 2016-09-15 ENCOUNTER — Telehealth: Payer: Self-pay | Admitting: Family Medicine

## 2016-09-15 ENCOUNTER — Ambulatory Visit: Payer: PPO | Admitting: Family Medicine

## 2016-09-15 DIAGNOSIS — S022XXA Fracture of nasal bones, initial encounter for closed fracture: Secondary | ICD-10-CM | POA: Diagnosis not present

## 2016-09-15 NOTE — Telephone Encounter (Signed)
Pt notified. Dr. Lucia Gaskins [phone].  Pt will call us back to reschedule her F/U this am. Victorino December

## 2016-09-15 NOTE — Telephone Encounter (Signed)
Pt wants to know who you recommended as an ENT?  Pt ph [phone]

## 2016-09-15 NOTE — Telephone Encounter (Signed)
Please call her and give her Dr. Lucia Gaskins ENT phone number.

## 2016-09-16 ENCOUNTER — Encounter (HOSPITAL_BASED_OUTPATIENT_CLINIC_OR_DEPARTMENT_OTHER): Payer: Self-pay | Admitting: *Deleted

## 2016-09-16 ENCOUNTER — Ambulatory Visit: Payer: Self-pay | Admitting: Otolaryngology
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
## 2016-09-19 ENCOUNTER — Ambulatory Visit (HOSPITAL_BASED_OUTPATIENT_CLINIC_OR_DEPARTMENT_OTHER): Payer: PPO | Admitting: Certified Registered"

## 2016-09-19 ENCOUNTER — Encounter (HOSPITAL_BASED_OUTPATIENT_CLINIC_OR_DEPARTMENT_OTHER)
Admission: RE | Disposition: A | Discharge status: Home / Self Care | Payer: Self-pay | Source: Ambulatory Visit | Attending: Otolaryngology

## 2016-09-19 ENCOUNTER — Ambulatory Visit (HOSPITAL_BASED_OUTPATIENT_CLINIC_OR_DEPARTMENT_OTHER)
Admission: RE | Admit: 2016-09-19 | Discharge: 2016-09-19 | Disposition: A | Discharge status: Home / Self Care | Payer: PPO | Source: Ambulatory Visit | Attending: Otolaryngology | Admitting: Otolaryngology

## 2016-09-19 ENCOUNTER — Encounter (HOSPITAL_BASED_OUTPATIENT_CLINIC_OR_DEPARTMENT_OTHER): Payer: Self-pay | Admitting: *Deleted

## 2016-09-19 DIAGNOSIS — E7800 Pure hypercholesterolemia, unspecified: Secondary | ICD-10-CM | POA: Diagnosis not present

## 2016-09-19 DIAGNOSIS — J343 Hypertrophy of nasal turbinates: Secondary | ICD-10-CM | POA: Insufficient documentation

## 2016-09-19 DIAGNOSIS — J45909 Unspecified asthma, uncomplicated: Secondary | ICD-10-CM | POA: Insufficient documentation

## 2016-09-19 DIAGNOSIS — Y92410 Unspecified street and highway as the place of occurrence of the external cause: Secondary | ICD-10-CM | POA: Diagnosis not present

## 2016-09-19 DIAGNOSIS — Z87891 Personal history of nicotine dependence: Secondary | ICD-10-CM | POA: Diagnosis not present

## 2016-09-19 DIAGNOSIS — S022XXA Fracture of nasal bones, initial encounter for closed fracture: Secondary | ICD-10-CM | POA: Diagnosis not present

## 2016-09-19 HISTORY — DX: Fracture of nasal bones, initial encounter for closed fracture: S02.2XXA

## 2016-09-19 HISTORY — PX: CLOSED REDUCTION NASAL FRACTURE: SHX5365

## 2016-09-19 HISTORY — PX: TURBINATE REDUCTION: SHX6157

## 2016-09-19 HISTORY — DX: Unspecified asthma, uncomplicated: J45.909

## 2016-09-19 SURGERY — CLOSED REDUCTION, FRACTURE, NASAL BONE
Anesthesia: General | Site: Nose

## 2016-09-19 MED ORDER — MIDAZOLAM HCL 2 MG/2ML IJ SOLN: 1.0000 mg | INTRAMUSCULAR | Status: DC | PRN

## 2016-09-19 MED ORDER — SCOPOLAMINE 1 MG/3DAYS TD PT72: 1.0000 | MEDICATED_PATCH | Freq: Once | TRANSDERMAL | Status: DC | PRN

## 2016-09-19 MED ORDER — PROPOFOL 500 MG/50ML IV EMUL
INTRAVENOUS | Status: AC
  Filled 2016-09-19: qty 50

## 2016-09-19 MED ORDER — SUCCINYLCHOLINE CHLORIDE 200 MG/10ML IV SOSY
PREFILLED_SYRINGE | INTRAVENOUS | Status: AC
  Filled 2016-09-19: qty 10

## 2016-09-19 MED ORDER — LIDOCAINE 2% (20 MG/ML) 5 ML SYRINGE
INTRAMUSCULAR | Status: AC
  Filled 2016-09-19: qty 5

## 2016-09-19 MED ORDER — FENTANYL CITRATE (PF) 100 MCG/2ML IJ SOLN
INTRAMUSCULAR | Status: AC
  Filled 2016-09-19: qty 2

## 2016-09-19 MED ORDER — DEXAMETHASONE SODIUM PHOSPHATE 10 MG/ML IJ SOLN
INTRAMUSCULAR | Status: AC
  Filled 2016-09-19: qty 1

## 2016-09-19 MED ORDER — HYDROMORPHONE HCL 1 MG/ML IJ SOLN: 0.2500 mg | INTRAMUSCULAR | Status: DC | PRN

## 2016-09-19 MED ORDER — ONDANSETRON HCL 4 MG/2ML IJ SOLN
INTRAMUSCULAR | Status: AC
  Filled 2016-09-19: qty 2

## 2016-09-19 MED ORDER — ROCURONIUM BROMIDE 100 MG/10ML IV SOLN
INTRAVENOUS | Status: DC | PRN
  Administered 2016-09-19: 40 mg via INTRAVENOUS

## 2016-09-19 MED ORDER — EPHEDRINE SULFATE 50 MG/ML IJ SOLN
INTRAMUSCULAR | Status: DC | PRN
  Administered 2016-09-19: 10 mg via INTRAVENOUS

## 2016-09-19 MED ORDER — ROCURONIUM BROMIDE 10 MG/ML (PF) SYRINGE
PREFILLED_SYRINGE | INTRAVENOUS | Status: AC
  Filled 2016-09-19: qty 10

## 2016-09-19 MED ORDER — MUPIROCIN 2 % EX OINT
TOPICAL_OINTMENT | CUTANEOUS | Status: AC
  Filled 2016-09-19: qty 22

## 2016-09-19 MED ORDER — LIDOCAINE HCL (CARDIAC) 20 MG/ML IV SOLN
INTRAVENOUS | Status: DC | PRN
  Administered 2016-09-19: 60 mg via INTRAVENOUS

## 2016-09-19 MED ORDER — FENTANYL CITRATE (PF) 100 MCG/2ML IJ SOLN
25.0000 ug | INTRAMUSCULAR | Status: DC | PRN
  Administered 2016-09-19: 50 ug via INTRAVENOUS
  Administered 2016-09-19: 25 ug via INTRAVENOUS

## 2016-09-19 MED ORDER — OXYMETAZOLINE HCL 0.05 % NA SOLN
NASAL | Status: DC | PRN
  Administered 2016-09-19: 1 via TOPICAL

## 2016-09-19 MED ORDER — ONDANSETRON HCL 4 MG/2ML IJ SOLN
INTRAMUSCULAR | Status: DC | PRN
  Administered 2016-09-19: 4 mg via INTRAVENOUS

## 2016-09-19 MED ORDER — LACTATED RINGERS IV SOLN
INTRAVENOUS | Status: DC
  Administered 2016-09-19 (×2): via INTRAVENOUS

## 2016-09-19 MED ORDER — OXYMETAZOLINE HCL 0.05 % NA SOLN
NASAL | Status: AC
  Filled 2016-09-19: qty 15

## 2016-09-19 MED ORDER — CEFAZOLIN SODIUM-DEXTROSE 2-4 GM/100ML-% IV SOLN
2.0000 g | INTRAVENOUS | Status: AC
  Administered 2016-09-19: 2 g via INTRAVENOUS

## 2016-09-19 MED ORDER — SUGAMMADEX SODIUM 500 MG/5ML IV SOLN
INTRAVENOUS | Status: DC | PRN
  Administered 2016-09-19: 250 mg via INTRAVENOUS

## 2016-09-19 MED ORDER — DEXAMETHASONE SODIUM PHOSPHATE 4 MG/ML IJ SOLN
INTRAMUSCULAR | Status: DC | PRN
  Administered 2016-09-19: 10 mg via INTRAVENOUS

## 2016-09-19 MED ORDER — SODIUM CHLORIDE 0.9 % IV SOLN
INTRAVENOUS | Status: DC | PRN
  Administered 2016-09-19: 100 mL

## 2016-09-19 MED ORDER — SUGAMMADEX SODIUM 500 MG/5ML IV SOLN
INTRAVENOUS | Status: AC
  Filled 2016-09-19: qty 5

## 2016-09-19 MED ORDER — CEFAZOLIN SODIUM-DEXTROSE 2-4 GM/100ML-% IV SOLN
INTRAVENOUS | Status: AC
  Filled 2016-09-19: qty 100

## 2016-09-19 MED ORDER — LIDOCAINE-EPINEPHRINE 1 %-1:100000 IJ SOLN
INTRAMUSCULAR | Status: DC | PRN
  Administered 2016-09-19: 5 mL

## 2016-09-19 MED ORDER — PROPOFOL 10 MG/ML IV BOLUS
INTRAVENOUS | Status: DC | PRN
  Administered 2016-09-19: 150 mg via INTRAVENOUS

## 2016-09-19 MED ORDER — FENTANYL CITRATE (PF) 100 MCG/2ML IJ SOLN: 50.0000 ug | INTRAMUSCULAR | Status: DC | PRN

## 2016-09-19 MED ORDER — LIDOCAINE-EPINEPHRINE 1 %-1:100000 IJ SOLN
INTRAMUSCULAR | Status: AC
  Filled 2016-09-19: qty 1

## 2016-09-19 SURGICAL SUPPLY — 43 items
APPLICATOR COTTON TIP 6IN STRL (MISCELLANEOUS) IMPLANT
BENZOIN TINCTURE PRP APPL 2/3 (GAUZE/BANDAGES/DRESSINGS) ×3 IMPLANT
BLADE INF TURB ROT M4 2 5PK (BLADE) ×3 IMPLANT
BLADE SURG 15 STRL LF DISP TIS (BLADE) IMPLANT
BLADE SURG 15 STRL SS (BLADE)
CANISTER SUCT 1200ML W/VALVE (MISCELLANEOUS) ×6 IMPLANT
COAGULATOR SUCT 8FR VV (MISCELLANEOUS) ×3 IMPLANT
CONT SPEC 4OZ CLIKSEAL STRL BL (MISCELLANEOUS) IMPLANT
COVER MAYO STAND STRL (DRAPES) ×3 IMPLANT
DECANTER SPIKE VIAL GLASS SM (MISCELLANEOUS) IMPLANT
DEPRESSOR TONGUE BLADE STERILE (MISCELLANEOUS) IMPLANT
DRSG TELFA 3X8 NADH (GAUZE/BANDAGES/DRESSINGS) IMPLANT
ELECT REM PT RETURN 9FT ADLT (ELECTROSURGICAL) ×3
ELECTRODE REM PT RTRN 9FT ADLT (ELECTROSURGICAL) ×2 IMPLANT
GAUZE VASELINE FOILPK 1/2 X 72 (GAUZE/BANDAGES/DRESSINGS) IMPLANT
GLOVE BIOGEL PI IND STRL 8 (GLOVE) ×4 IMPLANT
GLOVE BIOGEL PI INDICATOR 8 (GLOVE) ×2
GLOVE SS BIOGEL STRL SZ 7.5 (GLOVE) ×2 IMPLANT
GLOVE SUPERSENSE BIOGEL SZ 7.5 (GLOVE) ×1
GLOVE SURG SS PI 7.5 STRL IVOR (GLOVE) ×3 IMPLANT
GOWN STRL REUS W/ TWL LRG LVL3 (GOWN DISPOSABLE) ×2 IMPLANT
GOWN STRL REUS W/TWL 2XL LVL3 (GOWN DISPOSABLE) ×3 IMPLANT
GOWN STRL REUS W/TWL LRG LVL3 (GOWN DISPOSABLE) ×1
IV NS 500ML (IV SOLUTION)
IV NS 500ML BAXH (IV SOLUTION) IMPLANT
MARKER SKIN DUAL TIP RULER LAB (MISCELLANEOUS) IMPLANT
NEEDLE PRECISIONGLIDE 27X1.5 (NEEDLE) ×3 IMPLANT
PACK BASIN DAY SURGERY FS (CUSTOM PROCEDURE TRAY) ×3 IMPLANT
PATTIES SURGICAL .5 X3 (DISPOSABLE) ×3 IMPLANT
SHEET MEDIUM DRAPE 40X70 STRL (DRAPES) ×3 IMPLANT
SLEEVE SCD COMPRESS KNEE MED (MISCELLANEOUS) ×3 IMPLANT
SPLINT NASAL THERMO PLAST (MISCELLANEOUS) ×3 IMPLANT
SPONGE GAUZE 2X2 8PLY STRL LF (GAUZE/BANDAGES/DRESSINGS) ×3 IMPLANT
SPONGE GAUZE 4X4 12PLY STER LF (GAUZE/BANDAGES/DRESSINGS) ×3 IMPLANT
STRIP CLOSURE SKIN 1/2X4 (GAUZE/BANDAGES/DRESSINGS) ×3 IMPLANT
SUT CHROMIC 4 0 PS 2 18 (SUTURE) IMPLANT
SUT ETHILON 3 0 PS 1 (SUTURE) IMPLANT
SUT SILK 2 0 FS (SUTURE) IMPLANT
SYR 3ML 18GX1 1/2 (SYRINGE) IMPLANT
SYR CONTROL 10ML LL (SYRINGE) IMPLANT
TOWEL OR 17X24 6PK STRL BLUE (TOWEL DISPOSABLE) ×3 IMPLANT
TUBE CONNECTING 20X1/4 (TUBING) ×3 IMPLANT
YANKAUER SUCT BULB TIP NO VENT (SUCTIONS) ×3 IMPLANT

## 2016-09-19 NOTE — Anesthesia Procedure Notes (Signed)
Procedure Name: Intubation Date/Time: 09/19/2016 7:35 AM Performed by: Kip Kautzman D Pre-anesthesia Checklist: Patient identified, Emergency Drugs available, Suction available and Patient being monitored Patient Re-evaluated:Patient Re-evaluated prior to inductionOxygen Delivery Method: Circle system utilized Preoxygenation: Pre-oxygenation with 100% oxygen Intubation Type: IV induction Ventilation: Mask ventilation without difficulty Laryngoscope Size: Mac and 3 Grade View: Grade I Tube type: Oral Tube size: 7.0 mm Number of attempts: 1 Airway Equipment and Method: Stylet and Oral airway Placement Confirmation: ETT inserted through vocal cords under direct vision,  positive ETCO2 and breath sounds checked- equal and bilateral Secured at: 21 cm Tube secured with: Tape Dental Injury: Teeth and Oropharynx as per pre-operative assessment

## 2016-09-19 NOTE — Transfer of Care (Signed)
Immediate Anesthesia Transfer of Care Note  Patient: Deborah Bray  Procedure(s) Performed: Procedure(s): CLOSED REDUCTION NASAL FRACTURE (N/A) TURBINATE REDUCTION (Bilateral)  Patient Location: PACU  Anesthesia Type:General  Level of Consciousness: awake, alert , oriented and patient cooperative  Airway & Oxygen Therapy: Patient Spontanous Breathing and Patient connected to face mask oxygen  Post-op Assessment: Report given to RN and Post -op Vital signs reviewed and stable  Post vital signs: Reviewed and stable  Last Vitals:  Vitals:   09/19/16 0608  BP: 136/86  Pulse: 69  Resp: 18  Temp: 36.2 C    Last Pain:  Vitals:   09/19/16 0608  TempSrc: Oral  PainSc: 2          Complications: No apparent anesthesia complications

## 2016-09-19 NOTE — Anesthesia Preprocedure Evaluation (Addendum)
Anesthesia Evaluation  Patient identified by MRN, date of birth, ID band Patient awake    Reviewed: Allergy & Precautions, H&P , NPO status , Patient's Chart, lab work & pertinent test results  Airway Mallampati: II  TM Distance: >3 FB Neck ROM: Full    Dental no notable dental hx. (+) Teeth Intact, Dental Advisory Given   Pulmonary asthma , former smoker,    Pulmonary exam normal breath sounds clear to auscultation       Cardiovascular negative cardio ROS   Rhythm:Regular Rate:Normal     Neuro/Psych  Headaches, PSYCHIATRIC DISORDERS Depression    GI/Hepatic negative GI ROS, Neg liver ROS,   Endo/Other  negative endocrine ROS  Renal/GU negative Renal ROS  negative genitourinary   Musculoskeletal   Abdominal   Peds  Hematology negative hematology ROS (+)   Anesthesia Other Findings   Reproductive/Obstetrics negative OB ROS                           Anesthesia Physical Anesthesia Plan  ASA: II  Anesthesia Plan: General   Post-op Pain Management:    Induction: Intravenous  Airway Management Planned: Oral ETT  Additional Equipment:   Intra-op Plan:   Post-operative Plan: Extubation in OR  Informed Consent: I have reviewed the patients History and Physical, chart, labs and discussed the procedure including the risks, benefits and alternatives for the proposed anesthesia with the patient or authorized representative who has indicated his/her understanding and acceptance.   Dental advisory given  Plan Discussed with: CRNA  Anesthesia Plan Comments:         Anesthesia Quick Evaluation

## 2016-09-19 NOTE — Discharge Instructions (Addendum)
Tylenol or ibuprofen prn pain Elevate head of bed and apply cool compress to nose to reduce swelling and bleeding Keep cast dry Return to see Dr Lucia Gaskins next Thursday at 4:30 to have the cast removed Call office if any problems or questions   [phone]   Post Anesthesia Home Care Instructions  Activity: Get plenty of rest for the remainder of the day. A responsible adult should stay with you for 24 hours following the procedure.  For the next 24 hours, DO NOT: -Drive a car -Paediatric nurse -Drink alcoholic beverages -Take any medication unless instructed by your physician -Make any legal decisions or sign important papers.  Meals: Start with liquid foods such as gelatin or soup. Progress to regular foods as tolerated. Avoid greasy, spicy, heavy foods. If nausea and/or vomiting occur, drink only clear liquids until the nausea and/or vomiting subsides. Call your physician if vomiting continues.  Special Instructions/Symptoms: Your throat may feel dry or sore from the anesthesia or the breathing tube placed in your throat during surgery. If this causes discomfort, gargle with warm salt water. The discomfort should disappear within 24 hours.  If you had a scopolamine patch placed behind your ear for the management of post- operative nausea and/or vomiting:  1. The medication in the patch is effective for 72 hours, after which it should be removed.  Wrap patch in a tissue and discard in the trash. Wash hands thoroughly with soap and water. 2. You may remove the patch earlier than 72 hours if you experience unpleasant side effects which may include dry mouth, dizziness or visual disturbances. 3. Avoid touching the patch. Wash your hands with soap and water after contact with the patch.

## 2016-09-19 NOTE — Anesthesia Postprocedure Evaluation (Signed)
Anesthesia Post Note  Patient: Driscilla Grammes  Procedure(s) Performed: Procedure(s) (LRB): CLOSED REDUCTION NASAL FRACTURE (N/A) TURBINATE REDUCTION (Bilateral)  Patient location during evaluation: PACU Anesthesia Type: General Level of consciousness: awake and alert Pain management: pain level controlled Vital Signs Assessment: post-procedure vital signs reviewed and stable Respiratory status: spontaneous breathing, nonlabored ventilation and respiratory function stable Cardiovascular status: blood pressure returned to baseline and stable Postop Assessment: no signs of nausea or vomiting Anesthetic complications: no    Last Vitals:  Vitals:   09/19/16 0945 09/19/16 1027  BP: 124/81 (!) 147/97  Pulse: 73 75  Resp: 13 16  Temp:  36.8 C    Last Pain:  Vitals:   09/19/16 1027  TempSrc: Oral  PainSc: 3                  Adyn Hoes,W. EDMOND

## 2016-09-19 NOTE — Interval H&P Note (Signed)
History and Physical Interval Note:  09/19/2016 7:28 AM  Deborah Bray  has presented today for surgery, with the diagnosis of NASAL FRACTURE, BILATERAL TURBINATE HYPERTROPHY  The various methods of treatment have been discussed with the patient and family. After consideration of risks, benefits and other options for treatment, the patient has consented to  Procedure(s): CLOSED REDUCTION NASAL FRACTURE (N/A) TURBINATE REDUCTION (Bilateral) as a surgical intervention .  The patient's history has been reviewed, patient examined, no change in status, stable for surgery.  I have reviewed the patient's chart and labs.  Questions were answered to the patient's satisfaction.     CHRISTOPHER NEWMAN

## 2016-09-19 NOTE — Brief Op Note (Signed)
09/19/2016  8:41 AM  PATIENT:  Deborah Bray  65 y.o. female  PRE-OPERATIVE DIAGNOSIS:  NASAL FRACTURE, BILATERAL TURBINATE HYPERTROPHY  POST-OPERATIVE DIAGNOSIS:  NASAL FRACTURE, BILATERAL TURBINATE HYPERTROPHY  PROCEDURE:  Procedure(s): CLOSED REDUCTION NASAL FRACTURE (N/A) TURBINATE REDUCTION (Bilateral)  SURGEON:  Surgeon(s) and Role:    * Rozetta Nunnery, MD - Primary  PHYSICIAN ASSISTANT:   ASSISTANTS: none   ANESTHESIA:   general  EBL:  Total I/O In: 1000 [I.V.:1000] Out: -   BLOOD ADMINISTERED:none  DRAINS: none   LOCAL MEDICATIONS USED:  XYLOCAINE with EPI 5 cc  SPECIMEN:  No Specimen  DISPOSITION OF SPECIMEN:  N/A  COUNTS:  YES  TOURNIQUET:  * No tourniquets in log *  DICTATION: .Other Dictation: Dictation Number [id number]  PLAN OF CARE: Discharge to home after PACU  PATIENT DISPOSITION:  PACU - hemodynamically stable.   Delay start of Pharmacological VTE agent (>24hrs) due to surgical blood loss or risk of bleeding: yes
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
## 2016-09-25 ENCOUNTER — Ambulatory Visit (INDEPENDENT_AMBULATORY_CARE_PROVIDER_SITE_OTHER): Payer: PPO | Admitting: Family Medicine

## 2016-09-25 ENCOUNTER — Encounter (HOSPITAL_BASED_OUTPATIENT_CLINIC_OR_DEPARTMENT_OTHER): Payer: Self-pay | Admitting: Otolaryngology

## 2016-09-25 DIAGNOSIS — M542 Cervicalgia: Secondary | ICD-10-CM | POA: Diagnosis not present

## 2016-09-25 DIAGNOSIS — M545 Low back pain, unspecified: Secondary | ICD-10-CM

## 2016-09-25 DIAGNOSIS — M25561 Pain in right knee: Secondary | ICD-10-CM
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
## 2016-09-30 ENCOUNTER — Ambulatory Visit
Admission: RE | Admit: 2016-09-30 | Discharge: 2016-09-30 | Disposition: A | Discharge status: Home / Self Care | Payer: PPO | Source: Ambulatory Visit | Attending: Family Medicine | Admitting: Family Medicine

## 2016-09-30 DIAGNOSIS — M545 Low back pain, unspecified: Secondary | ICD-10-CM

## 2016-09-30 DIAGNOSIS — M25561 Pain in right knee: Secondary | ICD-10-CM

## 2016-09-30 DIAGNOSIS — M179 Osteoarthritis of knee, unspecified: Secondary | ICD-10-CM | POA: Diagnosis not present

## 2016-10-20 ENCOUNTER — Encounter: Payer: Self-pay | Admitting: Family Medicine

## 2016-10-20 ENCOUNTER — Ambulatory Visit (INDEPENDENT_AMBULATORY_CARE_PROVIDER_SITE_OTHER): Payer: PPO | Admitting: Family Medicine

## 2016-10-20 DIAGNOSIS — M542 Cervicalgia: Secondary | ICD-10-CM

## 2016-10-20 DIAGNOSIS — M25561 Pain in right knee: Secondary | ICD-10-CM

## 2016-10-20 DIAGNOSIS — M545 Low back pain, unspecified: Secondary | ICD-10-CM
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
## 2016-10-21 DIAGNOSIS — M545 Low back pain: Secondary | ICD-10-CM | POA: Diagnosis not present

## 2016-10-21 DIAGNOSIS — S83241A Other tear of medial meniscus, current injury, right knee, initial encounter: Secondary | ICD-10-CM | POA: Diagnosis not present

## 2016-10-21 DIAGNOSIS — M542 Cervicalgia: Secondary | ICD-10-CM | POA: Diagnosis not present

## 2016-11-24 DIAGNOSIS — M542 Cervicalgia: Secondary | ICD-10-CM | POA: Diagnosis not present

## 2016-11-26 DIAGNOSIS — M542 Cervicalgia: Secondary | ICD-10-CM | POA: Diagnosis not present

## 2017-01-08 NOTE — Progress Notes (Signed)
Left message for pt to call me back 

## 2017-01-23 ENCOUNTER — Encounter: Payer: Self-pay | Admitting: Family Medicine

## 2017-01-23 ENCOUNTER — Ambulatory Visit (INDEPENDENT_AMBULATORY_CARE_PROVIDER_SITE_OTHER): Payer: PPO | Admitting: Family Medicine

## 2017-01-23 VITALS — BP 120/80 | HR 66 | Ht 63.5 in | Wt 116.6 lb

## 2017-01-23 DIAGNOSIS — E2839 Other primary ovarian failure: Secondary | ICD-10-CM | POA: Diagnosis not present

## 2017-01-23 DIAGNOSIS — Z1231 Encounter for screening mammogram for malignant neoplasm of breast: Secondary | ICD-10-CM | POA: Diagnosis not present

## 2017-01-23 DIAGNOSIS — M810 Age-related osteoporosis without current pathological fracture: Secondary | ICD-10-CM | POA: Diagnosis not present

## 2017-01-23 DIAGNOSIS — R202 Paresthesia of skin: Secondary | ICD-10-CM | POA: Diagnosis not present

## 2017-01-23 DIAGNOSIS — M542 Cervicalgia: Secondary | ICD-10-CM

## 2017-01-23 DIAGNOSIS — Z1239 Encounter for other screening for malignant neoplasm of breast: Secondary | ICD-10-CM

## 2017-01-23 DIAGNOSIS — Z87891 Personal history of nicotine dependence: Secondary | ICD-10-CM | POA: Diagnosis not present

## 2017-01-23 DIAGNOSIS — Z1211 Encounter for screening for malignant neoplasm of colon: Secondary | ICD-10-CM

## 2017-01-23 DIAGNOSIS — Z23 Encounter for immunization: Secondary | ICD-10-CM | POA: Diagnosis not present

## 2017-01-23 DIAGNOSIS — Z1159 Encounter for screening for other viral diseases: Secondary | ICD-10-CM | POA: Diagnosis not present

## 2017-01-23 DIAGNOSIS — Z0000 Encounter for general adult medical examination without abnormal findings: Secondary | ICD-10-CM

## 2017-01-23 DIAGNOSIS — R5383 Other fatigue: Secondary | ICD-10-CM

## 2017-01-23 LAB — COMPREHENSIVE METABOLIC PANEL
ALK PHOS: 63 U/L (ref 33–130)
ALT: 13 U/L (ref 6–29)
AST: 24 U/L (ref 10–35)
Albumin: 4.7 g/dL (ref 3.6–5.1)
BUN: 22 mg/dL (ref 7–25)
CO2: 27 mmol/L (ref 20–31)
CREATININE: 0.9 mg/dL (ref 0.50–0.99)
Calcium: 9.9 mg/dL (ref 8.6–10.4)
Chloride: 104 mmol/L (ref 98–110)
Glucose, Bld: 88 mg/dL (ref 65–99)
Potassium: 4.3 mmol/L (ref 3.5–5.3)
SODIUM: 140 mmol/L (ref 135–146)
TOTAL PROTEIN: 7.8 g/dL (ref 6.1–8.1)
Total Bilirubin: 0.6 mg/dL (ref 0.2–1.2)

## 2017-01-23 LAB — POCT URINALYSIS DIPSTICK
Bilirubin, UA: NEGATIVE
Blood, UA: NEGATIVE
Glucose, UA: NEGATIVE
KETONES UA: NEGATIVE
Leukocytes, UA: NEGATIVE
Nitrite, UA: NEGATIVE
PH UA: 6
PROTEIN UA: NEGATIVE
Urobilinogen, UA: NEGATIVE

## 2017-01-23 LAB — CBC WITH DIFFERENTIAL/PLATELET
Basophils Absolute: 54 cells/uL (ref 0–200)
Basophils Relative: 1 %
EOS PCT: 6 %
Eosinophils Absolute: 324 cells/uL (ref 15–500)
HCT: 41 % (ref 35.0–45.0)
Hemoglobin: 13.4 g/dL (ref 11.7–15.5)
Lymphocytes Relative: 35 %
Lymphs Abs: 1890 cells/uL (ref 850–3900)
MCH: 31.2 pg (ref 27.0–33.0)
MCHC: 32.7 g/dL (ref 32.0–36.0)
MCV: 95.3 fL (ref 80.0–100.0)
MONOS PCT: 9 %
MPV: 11.2 fL (ref 7.5–12.5)
Monocytes Absolute: 486 cells/uL (ref 200–950)
NEUTROS ABS: 2646 {cells}/uL (ref 1500–7800)
Neutrophils Relative %: 49 %
PLATELETS: 245 10*3/uL (ref 140–400)
RBC: 4.3 MIL/uL (ref 3.80–5.10)
RDW: 14 % (ref 11.0–15.0)
WBC: 5.4 10*3/uL (ref 4.0–10.5)

## 2017-01-23 LAB — LIPID PANEL
Cholesterol: 303 mg/dL — ABNORMAL HIGH (ref <200)
HDL: 163 mg/dL (ref >50)
LDL CALC: 126 mg/dL — AB (ref <100)
TRIGLYCERIDES: 68 mg/dL (ref <150)
Total CHOL/HDL Ratio: 1.9 Ratio (ref <5.0)
VLDL: 14 mg/dL (ref <30)

## 2017-01-23 LAB — VITAMIN B12: Vitamin B-12: 415 pg/mL (ref 200–1100)

## 2017-01-23 LAB — HIV ANTIBODY (ROUTINE TESTING W REFLEX): HIV 1&2 Ab, 4th Generation: NONREACTIVE

## 2017-01-23 LAB — TSH: TSH: 1.46 mIU/L
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
## 2017-01-23 NOTE — Patient Instructions (Addendum)
Call and schedule your mammogram and bone density.   The orthopedist office will call you.   Go to the pharmacy and get your Tdap.   We will call you about the CT for lung cancer screening.   MEDICARE PREVENTATIVE SERVICES (FEMALE) AND PERSONALIZED PLAN for Deborah Bray January 23, 2017  CONDITIONS OR RISKS IDENTIFIED TODAY: Persistent neck pain. I am referring you to the orthopedist.   SPECIFIC RECOMMENDATIONS: Call and schedule mammogram and bone density.  Take the Tdap prescription to the pharmacy.  Call us in a couple of months and ask about the new shingles vaccine.     GENERAL RECOMMENDATIONS FOR GOOD HEALTH:  Supplements:  . Take a daily baby Aspirin 81mg  at bedtime for heart health unless you have a history of gastrointestinal bleed, allergy to aspirin, or are already taking higher dose Aspirin or other antiplatelet or blood thinner medication.   . Consume 1200 mg of Calcium daily through dietary calcium or supplement if you are female age 65 or older, or men 61 and older.   Men aged 4-70 should consume 1000 mg of Calcium daily. . Take 600 IU of Vitamin D daily.  Take 800 IU of Calcium daily if you are older than age 56.  . Take a general multivitamin daily.   Healthy diet: Eat a variety of foods, including fruits, vegetables, vegetable protein such as beans, lentils, tofu, and grains, such as rice.  Limit meat or animal protein, but if you eat meat, choose leans cuts such as chicken, fish, or [place].  Drink plenty of water daily.  Decrease saturated fat in the diet, avoid lots of red meat, processed foods, sweets, fast foods, and fried foods.  Limit salt and caffeine intake.  Exercise: Aerobic exercise helps maintain good heart health. Weight bearing exercise helps keep bones and muscles working strong.  We recommend at least 30-40 minutes of exercise most days of the week.   Fall prevention: Falls are the leading cause of injuries, accidents, and accidental deaths  in people over the age of 52. Falling is a real threat to your ability to live on your own.  Causes include poor eyesight or poor hearing, illness, poor lighting, throw rugs, clutter in your home, and medication side effects causing dizziness or balance problems.  Such medications can include medications for depression, sleep problems, high blood pressure, diabetes, and heart conditions.   PREVENTION  Be sure your home is as safe as possible. Here are some tips:  Wear shoes with non-skid soles (not house slippers).   Be sure your home and outside area are well lit.   Use night lights throughout your house, including hallways and stairways.   Remove clutter and clean up spills on floors and walkways.   Remove throw rugs or fasten them to the floor with carpet tape. Tack down carpet edges.   Do not place electrical cords across pathways.   Install grab bars in your bathtub, shower, and toilet area. Towel bars should not be used as a grab bar.   Install handrails on both sides of stairways.   Do not climb on stools or stepladders. Get someone else to help with jobs that require climbing.   Do not wax your floors at all, or use a non-skid wax.   Repair uneven or unsafe sidewalks, walkways or stairs.   Keep frequently used items within reach.   Be aware of pets so you do not trip.  Get regular check-ups from your doctor, and  take good care of yourself:  Have your eyes checked every year for vision changes, cataracts, glaucoma, and other eye problems. Wear eyeglasses as directed.   Have your hearing checked every 2 years, or anytime you or others think that you cannot hear well. Use hearing aids as directed.   See your caregiver if you have foot pain or corns. Sore feet can contribute to falls.   Let your caregiver know if a medicine is making you feel dizzy or making you lose your balance.   Use a cane, walker, or wheelchair as directed. Use walker or wheelchair brakes when getting  in and out.   When you get up from bed, sit on the side of the bed for 1 to 2 minutes before you stand up. This will give your blood pressure time to adjust, and you will feel less dizzy.   If you need to go to the bathroom often, consider using a bedside commode.  Disease prevention:  If you smoke or chew tobacco, find out from your caregiver how to quit. It can literally save your life, no matter how long you have been a tobacco user. If you do not use tobacco, never begin. Medicare does cover some smoking cessation counseling.  Maintain a healthy diet and normal weight. Increased weight leads to problems with blood pressure and diabetes. We check your height, weight, and BMI as part of your yearly visit.  The Body Mass Index or BMI is a way of measuring how much of your body is fat. Having a BMI above 27 increases the risk of heart disease, diabetes, hypertension, stroke and other problems related to obesity. Your caregiver can help determine your BMI and based on it develop an exercise and dietary program to help you achieve or maintain this important measurement at a healthful level.  High blood pressure causes heart and blood vessel problems.  Persistent high blood pressure should be treated with medicine if weight loss and exercise do not work.  We check your blood pressure as part of your yearly visit.  Avoid drinking alcohol in excess (more than two drinks per day).  Avoid use of street drugs. Do not share needles with anyone. Ask for professional help if you need assistance or instructions on stopping the use of alcohol, cigarettes, and/or drugs.  Brush your teeth twice a day with fluoride toothpaste, and floss once a day. Good oral hygiene prevents tooth decay and gum disease. The problems can be painful, unattractive, and can cause other health problems. Visit your dentist for a routine oral and dental checkup and preventive care every 6-12 months.   See your eye doctor yearly for  routine screening for things like glaucoma.  Look at your skin regularly.  Use a mirror to look at your back. Notify your caregivers of changes in moles, especially if there are changes in shapes, colors, a size larger than a pencil eraser, an irregular border, or development of new moles.  Safety:  Use seatbelts 100% of the time, whether driving or as a passenger.  Use safety devices such as hearing protection if you work in environments with loud noise or significant background noise.  Use safety glasses when doing any work that could send debris in to the eyes.  Use a helmet if you ride a bike or motorcycle.  Use appropriate safety gear for contact sports.  Talk to your caregiver about gun safety.  Use sunscreen with a SPF (or skin protection factor) of 15 or greater.  Lighter skinned people are at a greater risk of skin cancer. Don't forget to also wear sunglasses in order to protect your eyes from too much damaging sunlight. Damaging sunlight can accelerate cataract formation.   If you have multiple sexual partners, or if you are not in a monogamous relationship, practice safe sex. Use condoms. Condoms are used to help reduce the spread of sexually transmitted infections (or STIs).  Consider an HIV test if you have never been tested.  Consider routine screening for STIs if you have multiple sexual partners.   Keep carbon monoxide and smoke detectors in your home functioning at all times. Change the batteries every 6 months or use a model that plugs into the wall or is hard wired in.   END OF LIFE PLANNING/ADVANCED DIRECTIVES Advance health-care planning is deciding the kind of care you want at the end of life. While alert competent adults are able to exercise their rights to make health care and financial decisions, problems arise when an individual becomes unconscious, incapacitated, or otherwise unable to communicate or make such decisions. Advance health care directives are the legal  documents in which you give written instructions about your choices limited, aggressive or palliative care if, in the future, you cannot speak for yourself.  Advanced directives include the following: [place] allows you to appoint someone to act as your health care agent to make health care decisions for you should it be determined by your health care provider that you are no longer able to make these decisions for yourself.  A Living Will is a legal document in which you can declare that under certain conditions you desire your life not be prolonged by extraordinary or artificial means during your last illness or when you are near death. We can provide you with sample advanced directives, you can get an attorney to prepare these for you, or you can visit [place] Secretary of State's website for additional information and resources at [email or web address]  Further, I recommend you have an attorney prepare a Will and Durable Power of Attorney if you haven't done so already.  Please get [place] a copy of your health care Advanced Directives.   PREVENTATIV E CARE RECOMMENDATIONS:  Vaccinations: We recommend the following vaccinations as part of your preventative care:  Pneumococcal vaccine is recommended to protect against certain types of pneumonia.  This is normally recommended for adults age 76 or older once, or up to every 5 years for those at high risk.  The vaccine is also recommended for adults younger than 68 years old with certain underlying conditions that make them high risk for pneumonia.  Influenza vaccine is recommended to protect against seasonal influenza or "the flu." Influenza is a serious disease that can lead to hospitalization and sometimes even death. Traditional flu vaccines (called trivalent vaccines) are made to protect against three flu viruses; an influenza A (H1N1) virus, an influenza A (H3N2) virus, and an influenza B virus. In addition, there  are flu vaccines made to protect against four flu viruses (called "quadrivalent" vaccines). These vaccines protect against the same viruses as the trivalent vaccine and an additional B virus.  We recommend the high dose influenza vaccine to those 65 years and older.  Hepatitis B vaccine to protect against a form of infection of the liver by a virus acquired from blood or body fluids, particularly for high risk groups.  Td or Tdap vaccine to protect against Tetanus, diphtheria and pertussis which can be  very serious.  These diseases are caused by bacteria.  Diphtheria and pertussis are spread from person to person through coughing or sneezing.  Tetanus enters the body through cuts, scratches, or wounds.  Tetanus (Lockjaw) causes painful muscle tightening and stiffness, usually all over the body.  Diphtheria can cause a thick coating to form in the back of the throat.  It can lead to breathing problems, paralysis, heart failure, and death.  Pertussis (Whooping Cough) causes severe coughing spells, which can cause difficulty breathing, vomiting and disturbed sleep.  Td or Tdap is usually given every 10 years.  Shingles vaccine to protect against Varicella Zoster if you are older than age 38, or younger than 68 years old with certain underlying illness.    Cancer Screening: Most routine colon cancer screening begins at the age of 70.  Subsequent colonoscopies are performed either every 5-10 years for normal screening, or every 2-5 years for higher risks patients, up until age 4 years of age. Annual screening is done with easy to use take-home tests to check for hidden blood in the stool called hemoccult tests.  Sigmoidoscopy or colonoscopy can detect the earliest forms of colon cancer and is life saving. These tests use a small camera at the end of a tube to directly examine the colon.   Pelvic Exam and Pap Smear: Pelvic exams and pap smears are performed routinely to evaluate for abnormalities as well as  cancers including cervical and vaginal cancers.  This is generally performed every 2-3 years for most women, or more frequently for higher risk patients.  Mammograms: Mammograms are used to screen for breast cancer.  Medicare covers baseline screening once from ages 24-68 years old, but will cover mammograms yearly for those 40 years and older.  In accordance with other guidelines, you may not need a mammogram every year though.  The decision on how frequently you need a mammogram should be discussed with you medical provider.    Osteoporosis Screening: Screening for osteoporosis usually begins at age 85 for women, and can be done as frequent as every 2 years.  However, women or men with higher risk of osteoporosis may be screened earlier than age 7.  Osteoporosis or low bone mass is diminished bone strength from alterations in bone architecture leading to bone fragility and increased fracture risk.     Cardiovascular Screening: Fat and cholesterol leaves deposits in your arteries that can block them. This causes heart disease and vessel disease elsewhere in your body.  If your cholesterol is found to be high, or if you have heart disease or certain other medical conditions, then you may need to have your cholesterol monitored frequently and be treated with medication. Cardiovascular screening in the form of lab tests for cholesterol, HDL and triglycerides can be done every 5 years.  A screening electrocardiogram can be done as part of the Welcome to Medicare physical.  Diabetes Screening: Diabetes screening can be done at least every 3 years for those with risk factors,  or every 6-72months for prediabetic patients.  Screening includes fasting blood sugar test or glucose tolerance test.  Risk factors include hypertension, dyslipidemia, obesity, previously abnormal glucose tests, family history of diabetes, age 65 years or older, and history of gestations diabetes.   AAA (abdominal aortic aneurysm)  Screening: Medicare allows for a one time ultrasound to screen for abdominal aortic aneurysm if done as a referral as part of the Welcome to Medicare exam.  Men eligible for this screening include those  men between age 26-40 years of age who have smoked at least 100 cigarettes in his lifetime and/or has a family history of AAA.  HIV Screening:  Medicare allows for yearly screening for patients at high risk for contracting HIV disease.

## 2017-01-24 LAB — VITAMIN D 25 HYDROXY (VIT D DEFICIENCY, FRACTURES): Vit D, 25-Hydroxy: 30 ng/mL (ref 30–100)

## 2017-01-24 LAB — RPR

## 2017-01-24 LAB — HEPATITIS C ANTIBODY: HCV Ab: NEGATIVE

## 2017-02-03 DIAGNOSIS — H1045 Other chronic allergic conjunctivitis: Secondary | ICD-10-CM | POA: Diagnosis not present

## 2017-02-05 ENCOUNTER — Encounter (INDEPENDENT_AMBULATORY_CARE_PROVIDER_SITE_OTHER): Payer: Self-pay | Admitting: Orthopedic Surgery

## 2017-02-05 ENCOUNTER — Ambulatory Visit (INDEPENDENT_AMBULATORY_CARE_PROVIDER_SITE_OTHER): Payer: PPO | Admitting: Orthopedic Surgery

## 2017-02-05 DIAGNOSIS — M542 Cervicalgia: Secondary | ICD-10-CM | POA: Diagnosis not present

## 2017-02-05 MED ORDER — CYCLOBENZAPRINE HCL 10 MG PO TABS: 5.0000 mg | ORAL_TABLET | Freq: Three times a day (TID) | ORAL | 0 refills | Status: DC | PRN
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
## 2017-02-27 ENCOUNTER — Other Ambulatory Visit: Payer: PPO

## 2017-03-06 ENCOUNTER — Telehealth (INDEPENDENT_AMBULATORY_CARE_PROVIDER_SITE_OTHER): Payer: Self-pay | Admitting: Orthopedic Surgery

## 2017-03-06 ENCOUNTER — Ambulatory Visit
Admission: RE | Admit: 2017-03-06 | Discharge: 2017-03-06 | Disposition: A | Discharge status: Home / Self Care | Payer: PPO | Source: Ambulatory Visit | Attending: Orthopedic Surgery | Admitting: Orthopedic Surgery

## 2017-03-06 DIAGNOSIS — M542 Cervicalgia: Secondary | ICD-10-CM

## 2017-03-06 NOTE — Telephone Encounter (Signed)
Patient called needing something for her nerves, she had a MRI appointment scheduled for today but couldn't go through with it. CB # [phone]

## 2017-03-06 NOTE — Telephone Encounter (Signed)
Tried to call patient to discuss. No answer. LMVM for patient to [facility]

## 2017-03-09 ENCOUNTER — Telehealth (INDEPENDENT_AMBULATORY_CARE_PROVIDER_SITE_OTHER): Payer: Self-pay | Admitting: Orthopedic Surgery

## 2017-03-09 NOTE — Telephone Encounter (Signed)
Patient called wanting to speak with you about her options with her MRI. She's very anxious and doesn't believe she can go through with it unless she's put to sleep. CB # [phone]

## 2017-03-09 NOTE — Telephone Encounter (Signed)
Patient wants to know other options for imaging. Cannot have MRI due to getting very anxious. Said she will need to be put to sleep. Please advise.

## 2017-03-09 NOTE — Telephone Encounter (Signed)
Ct myelogram the other option pls call thx

## 2017-03-09 NOTE — Telephone Encounter (Signed)
Tried to call patient. No answer. LM advising of below per Dr Marlou Sa. Asked her to call back and let us know how she would like to proceed.

## 2017-03-10 ENCOUNTER — Other Ambulatory Visit (INDEPENDENT_AMBULATORY_CARE_PROVIDER_SITE_OTHER): Payer: Self-pay | Admitting: Orthopedic Surgery

## 2017-03-10 DIAGNOSIS — M542 Cervicalgia: Secondary | ICD-10-CM

## 2017-03-11 ENCOUNTER — Ambulatory Visit (INDEPENDENT_AMBULATORY_CARE_PROVIDER_SITE_OTHER): Payer: PPO | Admitting: Orthopedic Surgery

## 2017-03-16 ENCOUNTER — Telehealth (INDEPENDENT_AMBULATORY_CARE_PROVIDER_SITE_OTHER): Payer: Self-pay | Admitting: Orthopedic Surgery

## 2017-03-16 MED ORDER — DIAZEPAM 5 MG PO TABS: ORAL_TABLET | ORAL | 0 refills | Status: DC

## 2017-03-16 NOTE — Telephone Encounter (Signed)
Sent in valium for patient for MRI that is scheduled for Sunday

## 2017-03-16 NOTE — Telephone Encounter (Signed)
Thanks for sending in Valium

## 2017-03-16 NOTE — Telephone Encounter (Signed)
PT WANTS TO KNOW IF THERE IS SOMETHING WE CAN PRESCRIBE HER FOR ANXIETY AND PANICKING BEFORE HER MRI SHE IS GOING TO HAVE.  [place] [phone]

## 2017-03-17 NOTE — Telephone Encounter (Signed)
Tried calling to notify patient, no answer.  Did not leave message due to no name on recording.

## 2017-03-22 ENCOUNTER — Ambulatory Visit
Admission: RE | Admit: 2017-03-22 | Discharge: 2017-03-22 | Disposition: A | Discharge status: Home / Self Care | Payer: PPO | Source: Ambulatory Visit | Attending: Orthopedic Surgery | Admitting: Orthopedic Surgery

## 2017-03-22 DIAGNOSIS — M542 Cervicalgia: Secondary | ICD-10-CM

## 2017-03-22 DIAGNOSIS — M4802 Spinal stenosis, cervical region: Secondary | ICD-10-CM | POA: Diagnosis not present

## 2017-04-01 ENCOUNTER — Encounter: Payer: Self-pay | Admitting: Gynecology

## 2017-04-06 ENCOUNTER — Encounter (INDEPENDENT_AMBULATORY_CARE_PROVIDER_SITE_OTHER): Payer: Self-pay | Admitting: Orthopedic Surgery

## 2017-04-06 ENCOUNTER — Ambulatory Visit (INDEPENDENT_AMBULATORY_CARE_PROVIDER_SITE_OTHER): Payer: PPO | Admitting: Orthopedic Surgery

## 2017-04-06 DIAGNOSIS — M4802 Spinal stenosis, cervical region: Secondary | ICD-10-CM
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
## 2017-04-16 ENCOUNTER — Encounter: Payer: Self-pay | Admitting: Family Medicine

## 2017-04-16 ENCOUNTER — Ambulatory Visit (INDEPENDENT_AMBULATORY_CARE_PROVIDER_SITE_OTHER): Payer: PPO | Admitting: Family Medicine

## 2017-04-16 VITALS — BP 128/70 | HR 80 | Temp 98.2°F | Wt 116.8 lb

## 2017-04-16 DIAGNOSIS — J0140 Acute pansinusitis, unspecified: Secondary | ICD-10-CM

## 2017-04-16 MED ORDER — AMOXICILLIN 875 MG PO TABS: 875.0000 mg | ORAL_TABLET | Freq: Two times a day (BID) | ORAL | 0 refills | Status: DC

## 2017-04-16 NOTE — Progress Notes (Signed)
Subjective: Chief Complaint  Patient presents with  . sinus issues    sinus pressure. pressure over bridge of nose as well. started 1.5 days ago     Deborah Bray is a 66 y.o. female who presents for possible sinus infection.  Symptoms include a 2 day history of right sided maxillary pressure, upper teeth hurting and frontal.  Denies fever, chills, ear pain, sore throat, cough.  History of allergies and has been taking Benadryl.   Past history is significant for no history of pneumonia or bronchitis. Patient is a former smoker, quit >17  years ago.  Using OTC benadryl for symptoms.  Denies sick contacts.  No other aggravating or relieving factors.  No other c/o.  ROS as in subjective   Objective: Vitals:   04/16/17 1045  BP: 128/70  Pulse: 80  Temp: 98.2 F (36.8 C)    General appearance: Alert, WD/WN, no distress                             Skin: warm, no rash                           Head: + right frontal, maxillary sinus tenderness,                            Eyes: conjunctiva normal, corneas clear, PERRLA                            Ears: pearly TMs, external ear canals normal                          Nose: septum midline, turbinates swollen, with erythema and clear discharge             Mouth/throat: MMM, tongue normal, mild pharyngeal erythema                           Neck: supple, no adenopathy, no thyromegaly, nontender                          Heart: RRR, normal S1, S2, no murmurs                         Lungs: CTA bilaterally, no wheezes, rales, or rhonchi       Assessment and Plan: Acute non-recurrent pansinusitis  Prescription sent for amoxicillin. Recommend that she hold off on starting the antibiotic and treat her symptoms for then next 2-3 days and see if she turns the corner.  Can use OTC Mucinex for congestion.  Tylenol or Ibuprofen OTC for fever and malaise.  Discussed symptomatic relief, nasal saline flush, and call or return if worse or not back to  baseline after completing the antibiotic.

## 2017-04-17 ENCOUNTER — Ambulatory Visit
Admission: RE | Admit: 2017-04-17 | Discharge: 2017-04-17 | Disposition: A | Discharge status: Home / Self Care | Payer: PPO | Source: Ambulatory Visit | Attending: Family Medicine | Admitting: Family Medicine

## 2017-04-17 DIAGNOSIS — M810 Age-related osteoporosis without current pathological fracture: Secondary | ICD-10-CM

## 2017-04-17 DIAGNOSIS — E2839 Other primary ovarian failure: Secondary | ICD-10-CM

## 2017-04-20 ENCOUNTER — Institutional Professional Consult (permissible substitution): Payer: PPO | Admitting: Family Medicine

## 2017-04-22 ENCOUNTER — Encounter: Payer: Self-pay | Admitting: Family Medicine

## 2017-04-22 ENCOUNTER — Ambulatory Visit (INDEPENDENT_AMBULATORY_CARE_PROVIDER_SITE_OTHER): Payer: PPO | Admitting: Family Medicine

## 2017-04-22 VITALS — BP 110/70 | HR 77 | Wt 118.8 lb

## 2017-04-22 DIAGNOSIS — M810 Age-related osteoporosis without current pathological fracture: Secondary | ICD-10-CM

## 2017-04-22 DIAGNOSIS — R937 Abnormal findings on diagnostic imaging of other parts of musculoskeletal system: Secondary | ICD-10-CM | POA: Diagnosis not present

## 2017-04-22 NOTE — Assessment & Plan Note (Signed)
T-score -3.6

## 2017-04-22 NOTE — Progress Notes (Signed)
   Subjective:    Patient ID: Deborah Bray, female    DOB: [birth year], 66 y.o.   MRN: [id number]  HPI Chief Complaint  Patient presents with  . consult    discuss bone density and treatment   She is here to discuss abnormal bone density with a diagnosis of osteoporosis and treatment options.   History of osteoporosis per DEXA in 2012 but she is adamant that she did not know this and has not been on medication in the past.   She reports having difficulty swallowing at times and some reflux. Does not want to take oral medication for osteoporosis. She is interested in Prolia.    Review of Systems Pertinent positives and negatives in the history of present illness.     Objective:   Physical Exam BP 110/70   Pulse 77   Wt 118 lb 12.8 oz (53.9 kg)   BMI 20.71 kg/m   Alert and oriented and in no acute distress. Not otherwise examined.      Assessment & Plan:  Age-related osteoporosis without current pathological fracture  Abnormal bone density screening  She does not appear to be a good candidate for oral bisphosphonate due to upper GI history. Will start authorization process for Prolia.  Also counseled on getting adequate calcium, vitamin D and weight bearing exercise.  Follow up after she is approved for Prolia.

## 2017-04-22 NOTE — Patient Instructions (Addendum)
We will call you regarding the Prolia medication for your bones.   You need 1,200 mg of calcium daily and 1,000 IU of vitamin D.  Weight bearing exercises are important.   Osteoporosis Osteoporosis is the thinning and loss of density in the bones. Osteoporosis makes the bones more brittle, fragile, and likely to break (fracture). Over time, osteoporosis can cause the bones to become so weak that they fracture after a simple fall. The bones most likely to fracture are the bones in the hip, wrist, and spine. What are the causes? The exact cause is not known. What increases the risk? Anyone can develop osteoporosis. You may be at greater risk if you have a family history of the condition or have poor nutrition. You may also have a higher risk if you are:  Female.  68 years old or older.  A smoker.  Not physically active.  White or Asian.  Slender.  What are the signs or symptoms? A fracture might be the first sign of the disease, especially if it results from a fall or injury that would not usually cause a bone to break. Other signs and symptoms include:  Low back and neck pain.  Stooped posture.  Height loss.  How is this diagnosed? To make a diagnosis, your health care provider may:  Take a medical history.  Perform a physical exam.  Order tests, such as: ? A bone mineral density test. ? A dual-energy X-ray absorptiometry test.  How is this treated? The goal of osteoporosis treatment is to strengthen your bones to reduce your risk of a fracture. Treatment may involve:  Making lifestyle changes, such as: ? Eating a diet rich in calcium. ? Doing weight-bearing and muscle-strengthening exercises. ? Stopping tobacco use. ? Limiting alcohol intake.  Taking medicine to slow the process of bone loss or to increase bone density.  Monitoring your levels of calcium and vitamin D.  Follow these instructions at home:  Include calcium and vitamin D in your diet. Calcium  is important for bone health, and vitamin D helps the body absorb calcium.  Perform weight-bearing and muscle-strengthening exercises as directed by your health care provider.  Do not use any tobacco products, including cigarettes, chewing tobacco, and electronic cigarettes. If you need help quitting, ask your health care provider.  Limit your alcohol intake.  Take medicines only as directed by your health care provider.  Keep all follow-up visits as directed by your health care provider. This is important.  Take precautions at home to lower your risk of falling, such as: ? Keeping rooms well lit and clutter free. ? Installing safety rails on stairs. ? Using rubber mats in the bathroom and other areas that are often wet or slippery. Get help right away if: You fall or injure yourself. This information is not intended to replace advice given to you by your health care provider. Make sure you discuss any questions you have with your health care provider. Document Released: 08/13/2005 Document Revised: 04/07/2016 Document Reviewed: 04/13/2014 Elsevier Interactive Patient Education  2017 [employer].

## 2017-04-27 DIAGNOSIS — H1045 Other chronic allergic conjunctivitis: Secondary | ICD-10-CM | POA: Diagnosis not present

## 2017-04-27 DIAGNOSIS — H2513 Age-related nuclear cataract, bilateral: Secondary | ICD-10-CM | POA: Diagnosis not present

## 2017-04-28 ENCOUNTER — Institutional Professional Consult (permissible substitution) (INDEPENDENT_AMBULATORY_CARE_PROVIDER_SITE_OTHER): Payer: PPO | Admitting: Physical Medicine and Rehabilitation

## 2017-05-29 ENCOUNTER — Telehealth: Payer: Self-pay | Admitting: Family Medicine

## 2017-05-29 NOTE — Telephone Encounter (Signed)
Left message for pt to call. Finally have info concerning prolia.   Originally sent to [employer] on 04/23/2017. Received confirmation received on 04/28/2017. Received Verification of benefits on 07/06/208 requesting prior auth. Info requested was returned 05/25/2017. Insurance verification resent under [employer] per [employer] 05/27/2017. Received summary of benefits 05/27/2017. Pt's cost will be $230.00 Vickie would like pt to be on regardless of cost.  Left message for pt to call informing her. Financial assistance may be available thru [employer].

## 2017-06-10 NOTE — Telephone Encounter (Signed)
Left another message for pt to call

## 2017-06-23 NOTE — Telephone Encounter (Signed)
Pt was approved by her insurance. Her out of pocket expense is $230.00. This is not affordable for pt so a grant was requested thru the [employer] and she was approved.   Left message for pt to call to be placed on schedule to receive injection. Medication needs to be ordered once pt schedules an appt.  Paper reimbursement form must be completed and sent to foundation once her insurance pays.

## 2017-06-24 NOTE — Telephone Encounter (Signed)
Thanks

## 2017-06-24 NOTE — Telephone Encounter (Signed)
Pt returned call and appt was made to receive injection on 07/01/2017. Medication was ordered from [facility] on 06/24/2017.  Paper claim will need to be processed for foundation. I will get with [facility] on that.

## 2017-06-26 DIAGNOSIS — H00025 Hordeolum internum left lower eyelid: Secondary | ICD-10-CM | POA: Diagnosis not present

## 2017-06-26 DIAGNOSIS — H00022 Hordeolum internum right lower eyelid: Secondary | ICD-10-CM | POA: Diagnosis not present

## 2017-07-01 ENCOUNTER — Other Ambulatory Visit (INDEPENDENT_AMBULATORY_CARE_PROVIDER_SITE_OTHER): Payer: PPO

## 2017-07-01 DIAGNOSIS — M810 Age-related osteoporosis without current pathological fracture: Secondary | ICD-10-CM

## 2017-07-01 MED ORDER — DENOSUMAB 60 MG/ML ~~LOC~~ SOLN
60.0000 mg | Freq: Once | SUBCUTANEOUS | Status: AC
  Administered 2017-07-01: 60 mg via SUBCUTANEOUS

## 2017-08-18 DIAGNOSIS — J301 Allergic rhinitis due to pollen: Secondary | ICD-10-CM | POA: Diagnosis not present

## 2017-08-18 DIAGNOSIS — J3081 Allergic rhinitis due to animal (cat) (dog) hair and dander: Secondary | ICD-10-CM | POA: Diagnosis not present

## 2017-08-18 DIAGNOSIS — J3089 Other allergic rhinitis: Secondary | ICD-10-CM | POA: Diagnosis not present

## 2017-08-18 DIAGNOSIS — J4540 Moderate persistent asthma, uncomplicated: Secondary | ICD-10-CM | POA: Diagnosis not present

## 2017-10-16 DIAGNOSIS — J4540 Moderate persistent asthma, uncomplicated: Secondary | ICD-10-CM | POA: Diagnosis not present

## 2017-10-16 DIAGNOSIS — J3089 Other allergic rhinitis: Secondary | ICD-10-CM | POA: Diagnosis not present

## 2017-10-16 DIAGNOSIS — J3081 Allergic rhinitis due to animal (cat) (dog) hair and dander: Secondary | ICD-10-CM | POA: Diagnosis not present

## 2017-10-16 DIAGNOSIS — J301 Allergic rhinitis due to pollen: Secondary | ICD-10-CM | POA: Diagnosis not present

## 2017-11-20 ENCOUNTER — Telehealth: Payer: Self-pay | Admitting: Family Medicine

## 2017-11-20 NOTE — Telephone Encounter (Signed)
Left message for pt to call. Need to speak to her concerning prolia renewal for Feb.

## 2017-12-08 ENCOUNTER — Ambulatory Visit (INDEPENDENT_AMBULATORY_CARE_PROVIDER_SITE_OTHER): Payer: PPO | Admitting: Medical

## 2017-12-08 VITALS — BP 124/90 | HR 74 | Temp 97.9°F | Wt 120.4 lb

## 2017-12-08 DIAGNOSIS — J988 Other specified respiratory disorders: Secondary | ICD-10-CM | POA: Diagnosis not present

## 2017-12-08 DIAGNOSIS — Z87891 Personal history of nicotine dependence: Secondary | ICD-10-CM

## 2017-12-08 DIAGNOSIS — R05 Cough: Secondary | ICD-10-CM

## 2017-12-08 DIAGNOSIS — J4541 Moderate persistent asthma with (acute) exacerbation: Secondary | ICD-10-CM

## 2017-12-08 DIAGNOSIS — Z122 Encounter for screening for malignant neoplasm of respiratory organs: Secondary | ICD-10-CM

## 2017-12-08 DIAGNOSIS — R059 Cough, unspecified: Secondary | ICD-10-CM

## 2017-12-08 DIAGNOSIS — R509 Fever, unspecified: Secondary | ICD-10-CM

## 2017-12-08 HISTORY — DX: Encounter for screening for malignant neoplasm of respiratory organs: Z12.2

## 2017-12-08 MED ORDER — ALBUTEROL SULFATE HFA 108 (90 BASE) MCG/ACT IN AERS: 2.0000 | INHALATION_SPRAY | Freq: Four times a day (QID) | RESPIRATORY_TRACT | 1 refills | Status: DC | PRN

## 2017-12-08 MED ORDER — AZITHROMYCIN 250 MG PO TABS: ORAL_TABLET | ORAL | 0 refills | Status: DC

## 2017-12-08 NOTE — Progress Notes (Signed)
Subjective:  Deborah Bray is a 67 y.o. female who presents for 4 day hx/o deep chest cough, sinus pressure, sore throat, ear pressure.   Chest hurts when she coughs.   Thinks she had low grade fever.    Has had body aches.   No chills.   Has asthma, having some dyspnea.   when she usees inhaler, cough is more productive. Nonsmoker.  Is a former smoker, quit 15 years ago but smoked 30 years.   Was up to 5ppd at one point.   No NVD.  No hemoptysis.   Using nyquil, advil.   Needs refill on Albuterol.  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  No other aggravating or relieving factors.  No other c/o.  The following portions of the patient's history were reviewed and updated as appropriate: allergies, current medications, past family history, past medical history, past social history, past surgical history and problem list.  ROS as in subjective  Past Medical History:  Diagnosis Date  . Asthma    allergy induced  . CIN I (cervical intraepithelial neoplasia I)   . CIN III (cervical intraepithelial neoplasia III)   . Depression   . Elevated cholesterol   . Nasal fracture   . Osteoporosis    Current Outpatient Medications on File Prior to Visit  Medication Sig Dispense Refill  . budesonide-formoterol (SYMBICORT) 80-4.5 MCG/ACT inhaler Inhale 2 puffs into the lungs 2 (two) times daily as needed (asthma).     . sertraline (ZOLOFT) 100 MG tablet Take 100 mg by mouth daily.  1  . cyclobenzaprine (FLEXERIL) 10 MG tablet Take 1 tablet (10 mg total) by mouth 3 (three) times daily as needed for muscle spasms. (Patient not taking: Reported on 12/08/2017) 10 tablet 0   No current facility-administered medications on file prior to visit.    ROS as in subjective  Objective: BP 124/90   Pulse 74   Temp 97.9 F (36.6 C)   Wt 120 lb 6.4 oz (54.6 kg)   SpO2 96%   BMI 20.99 kg/m   General appearance: Alert, WD/WN, no  distress, somewhat ill appearing                             Skin: warm, no rash, no diaphoresis                           Head: right maxillary sinus tenderness                            Eyes: conjunctiva normal, corneas clear, PERRLA                            Ears: flat TMs, external ear canals normal                          Nose: septum midline, turbinates swollen, with erythema and no discharge             Mouth/throat: MMM, tongue normal, mild pharyngeal erythema and post nasal drainage                           Neck: supple, no adenopathy, no thyromegaly, nontender  Heart: RRR, normal S1, S2, no murmurs                         Lungs: +bronchial breath sounds, +scattered rhonchi, no wheezes, no rales                Extremities: no edema, nontender      Assessment: Encounter Diagnoses  Name Primary?  . Cough Yes  . Moderate persistent asthma with acute exacerbation   . Fever, unspecified fever cause   . Respiratory tract infection   . Former heavy tobacco smoker   . Encounter for screening for lung cancer      Plan:  Discussed diagnosis and treatment by respiratory tract infection.  Continue Symbicort, increase albuterol to at least 3 times a day for the next several days, rest, hydrate well can continue the over-the-counter measures she is doing.  If worse or not improving in the next few days call back, may need to add oral steroid if not improving  Looking back her primary care provider here [place] ordered CT chest cancer screening last year and she has not done that.  I recommend she go ahead and do the chest CT lung cancer screening   [name] was seen today for possible bronchitis.  Diagnoses and all orders for this visit:  Cough -     Pulse oximetry (single); Future  Moderate persistent asthma with acute exacerbation -     Pulse oximetry (single); Future  Fever, unspecified fever cause  Respiratory tract infection  Former  heavy tobacco smoker  Encounter for screening for lung cancer  Other orders -     azithromycin (ZITHROMAX) 250 MG tablet; 2 tablets day 1, then 1 tablet days 2-4 -     albuterol (PROVENTIL HFA;VENTOLIN HFA) 108 (90 Base) MCG/ACT inhaler; Inhale 2 puffs into the lungs every 6 (six) hours as needed for wheezing or shortness of breath.

## 2017-12-24 ENCOUNTER — Telehealth: Payer: Self-pay | Admitting: Family Medicine

## 2017-12-24 NOTE — Telephone Encounter (Signed)
Left message for pt to call concerning prolia injection. Pt needs to be informed that the only way I can get her insurance and [employer] criteria met is for medication to be called into [facility]. Need to speak to pt.

## 2018-01-05 ENCOUNTER — Other Ambulatory Visit (INDEPENDENT_AMBULATORY_CARE_PROVIDER_SITE_OTHER): Payer: PPO

## 2018-01-05 DIAGNOSIS — M810 Age-related osteoporosis without current pathological fracture: Secondary | ICD-10-CM

## 2018-01-05 MED ORDER — DENOSUMAB 60 MG/ML ~~LOC~~ SOLN
60.0000 mg | Freq: Once | SUBCUTANEOUS | Status: AC
  Administered 2018-01-05: 60 mg via SUBCUTANEOUS

## 2018-02-15 ENCOUNTER — Ambulatory Visit (INDEPENDENT_AMBULATORY_CARE_PROVIDER_SITE_OTHER): Payer: PPO | Admitting: Family Medicine

## 2018-02-15 ENCOUNTER — Encounter: Payer: Self-pay | Admitting: Family Medicine

## 2018-02-15 VITALS — BP 108/80 | HR 80 | Temp 99.1°F | Resp 12 | Wt 114.8 lb

## 2018-02-15 DIAGNOSIS — J302 Other seasonal allergic rhinitis: Secondary | ICD-10-CM | POA: Diagnosis not present

## 2018-02-15 DIAGNOSIS — J4520 Mild intermittent asthma, uncomplicated: Secondary | ICD-10-CM

## 2018-02-15 DIAGNOSIS — J4540 Moderate persistent asthma, uncomplicated: Secondary | ICD-10-CM | POA: Diagnosis not present

## 2018-02-15 MED ORDER — AZITHROMYCIN 250 MG PO TABS: ORAL_TABLET | ORAL | 0 refills | Status: DC

## 2018-02-15 MED ORDER — MONTELUKAST SODIUM 10 MG PO TABS: 10.0000 mg | ORAL_TABLET | Freq: Every day | ORAL | 3 refills | Status: DC

## 2018-02-15 MED ORDER — FLUTICASONE PROPIONATE 50 MCG/ACT NA SUSP
2.0000 | Freq: Every day | NASAL | 6 refills | Status: DC
Start: 2018-02-15 — End: 2018-08-30

## 2018-02-15 MED ORDER — ALBUTEROL SULFATE (2.5 MG/3ML) 0.083% IN NEBU
2.5000 mg | INHALATION_SOLUTION | Freq: Once | RESPIRATORY_TRACT | Status: AC
  Administered 2018-02-15: 2.5 mg via RESPIRATORY_TRACT

## 2018-02-15 NOTE — Patient Instructions (Signed)
Take the antibiotic as prescribed.   Continue using daily Symbicort and albuterol as needed.   Start treating your allergies with daily Xyzal over the counter and Flonase. I am also prescribing Singulair to take every evening.  We will maximize your allergy treatment.   Call or return if you are worsening or not improving over the next 2-3 days.

## 2018-02-15 NOTE — Progress Notes (Signed)
Chief Complaint  Patient presents with  . Sinusitis    with cough and congestion x1 day    Subjective:  Deborah Bray is a 67 y.o. female who presents for a 2 week history of rhinorrhea, watery eyes, nasal congestion, frontal headache, worsening cough, and wheezing.  Cough is now productive and she is having chest tightness.   She has a history of underlying allergies and asthma that is typically worse in the spring and fall. She is not currently treating her allergies.  Does not smoke.  No recent antibiotic use.   Reports taking Symbicort and has been using her albuterol inhaler over the past couple of days.   Denies fever, chills, palpitations, abdominal pain, N/V/D.   Treatment to date: antihistamines occasionally.   Denies sick contacts.  No other aggravating or relieving factors.  No other c/o.  ROS as in subjective.   Objective: Vitals:   02/15/18 1551 02/15/18 1633  BP: 108/80   Pulse: 76 80  Resp: 12   Temp: 99.1 F (37.3 C)   SpO2: 95% 98%    General appearance: Alert, WD/WN, no distress, mildly ill appearing                             Skin: warm, no rash, no pallor                            Head: no sinus tenderness                            Eyes: conjunctiva normal, corneas clear, PERRLA, clear drainage                             Ears: pearly TMs, external ear canals normal                          Nose: septum midline, turbinates swollen, with erythema and clear discharge             Mouth/throat: MMM, tongue normal, mild pharyngeal erythema                           Neck: supple, no adenopathy, no thyromegaly, nontender                          Heart: RRR, normal S1, S2, no murmurs                         Lungs: bilateral upper lobe wheezing and distant wheezes in lower lobes- post albuterol breathing treatment: CTA bilaterally, no wheezes, rales, or rhonchi      Assessment: Bronchitis, asthmatic, mild intermittent, uncomplicated - Plan:  azithromycin (ZITHROMAX Z-PAK) 250 MG tablet  Moderate persistent asthma without complication - Plan: albuterol (PROVENTIL) (2.5 MG/3ML) 0.083% nebulizer solution 2.5 mg  Seasonal allergic rhinitis, unspecified trigger - Plan: montelukast (SINGULAIR) 10 MG tablet    Plan: Discussed diagnosis and treatment of bronchitis related to asthma and allergies. Breathing treatment done in office and she responded well. No longer wheezing. Sp02 improved from 95% ot 98% and her peak flow also improved.  Z-pak, Singular and Flonase prescribed. I recommend maximizing allergy treatment since she has  a difficult time in the spring. Advised using Flonase daily, singulair at bedtime and Xyzal OTC daily. She will continue with Symbicort daily and albuterol inhaler as needed.  She declines steroids today and I am fine with this.  She will call or return if symptoms worsen or if she is not improving over the next 2-3 days.

## 2018-02-26 ENCOUNTER — Encounter: Payer: Self-pay | Admitting: Family Medicine

## 2018-05-19 ENCOUNTER — Telehealth: Payer: Self-pay | Admitting: Family Medicine

## 2018-05-19 NOTE — Telephone Encounter (Signed)
Since she has been seen for this work related injury, I recommend that she contact them for a note. Thanks.

## 2018-05-19 NOTE — Telephone Encounter (Signed)
Pt states she got hurt at work a couple weeks ago and a ladder fell on neck and she went and seen a doctor for worker's comp. She Is having headaches, can't stand for long periods of time and she can't keep her head straight for long periods of time and hurts to turn neck. I advised her that mose likely you could not write her a letter for neck pain for jury dismissal and she would need to contact the provider she saw for worker's comp. Please advise this is correct

## 2018-05-19 NOTE — Telephone Encounter (Signed)
Please call and find out if she has new symptoms or why she does not think she can do jury duty. I really cannot approve a dismissal request for neck pain since jury duty does not require any strenuous activity.

## 2018-05-19 NOTE — Telephone Encounter (Signed)
Tried to call pt but vm would not let me leave a message

## 2018-05-19 NOTE — Telephone Encounter (Signed)
Pt called requesting to speak to Vickie about getting a letter to excuse her from jury duty due to her neck issues. Please call pt on her cell phone to discuss or let her know if letter is approved.

## 2018-05-19 NOTE — Telephone Encounter (Signed)
Pt was advised and will contact other provider for jury note

## 2018-05-25 DIAGNOSIS — H2513 Age-related nuclear cataract, bilateral: Secondary | ICD-10-CM | POA: Diagnosis not present

## 2018-05-25 DIAGNOSIS — H18413 Arcus senilis, bilateral: Secondary | ICD-10-CM | POA: Diagnosis not present

## 2018-05-25 DIAGNOSIS — H1045 Other chronic allergic conjunctivitis: Secondary | ICD-10-CM | POA: Diagnosis not present

## 2018-05-27 ENCOUNTER — Telehealth: Payer: Self-pay

## 2018-05-27 NOTE — Telephone Encounter (Signed)
CALLED PT TO SCHEDULE AWV . NO ANSWER LVM FOR PT TO CALL BACK TO MAKE AN APPT. kh

## 2018-07-13 ENCOUNTER — Other Ambulatory Visit (INDEPENDENT_AMBULATORY_CARE_PROVIDER_SITE_OTHER): Payer: PPO

## 2018-07-13 DIAGNOSIS — M810 Age-related osteoporosis without current pathological fracture: Secondary | ICD-10-CM | POA: Diagnosis not present

## 2018-07-13 MED ORDER — DENOSUMAB 60 MG/ML ~~LOC~~ SOSY
60.0000 mg | PREFILLED_SYRINGE | Freq: Once | SUBCUTANEOUS | Status: AC
  Administered 2018-07-13: 60 mg via SUBCUTANEOUS

## 2018-07-14 DIAGNOSIS — J4540 Moderate persistent asthma, uncomplicated: Secondary | ICD-10-CM | POA: Diagnosis not present

## 2018-07-14 DIAGNOSIS — J3089 Other allergic rhinitis: Secondary | ICD-10-CM | POA: Diagnosis not present

## 2018-07-14 DIAGNOSIS — J301 Allergic rhinitis due to pollen: Secondary | ICD-10-CM | POA: Diagnosis not present

## 2018-07-14 DIAGNOSIS — J3081 Allergic rhinitis due to animal (cat) (dog) hair and dander: Secondary | ICD-10-CM | POA: Diagnosis not present

## 2018-07-15 DIAGNOSIS — M1611 Unilateral primary osteoarthritis, right hip: Secondary | ICD-10-CM | POA: Diagnosis not present

## 2018-07-15 DIAGNOSIS — M5136 Other intervertebral disc degeneration, lumbar region: Secondary | ICD-10-CM | POA: Diagnosis not present

## 2018-08-05 DIAGNOSIS — M1611 Unilateral primary osteoarthritis, right hip: Secondary | ICD-10-CM | POA: Diagnosis not present

## 2018-08-10 DIAGNOSIS — D225 Melanocytic nevi of trunk: Secondary | ICD-10-CM | POA: Diagnosis not present

## 2018-08-10 DIAGNOSIS — Z85828 Personal history of other malignant neoplasm of skin: Secondary | ICD-10-CM | POA: Diagnosis not present

## 2018-08-10 DIAGNOSIS — L821 Other seborrheic keratosis: Secondary | ICD-10-CM | POA: Diagnosis not present

## 2018-08-10 DIAGNOSIS — L814 Other melanin hyperpigmentation: Secondary | ICD-10-CM | POA: Diagnosis not present

## 2018-08-10 DIAGNOSIS — D1801 Hemangioma of skin and subcutaneous tissue: Secondary | ICD-10-CM | POA: Diagnosis not present

## 2018-08-10 DIAGNOSIS — Z85820 Personal history of malignant melanoma of skin: Secondary | ICD-10-CM | POA: Diagnosis not present

## 2018-08-10 DIAGNOSIS — L570 Actinic keratosis: Secondary | ICD-10-CM | POA: Diagnosis not present

## 2018-08-24 DIAGNOSIS — M25551 Pain in right hip: Secondary | ICD-10-CM | POA: Diagnosis not present

## 2018-08-24 DIAGNOSIS — M1611 Unilateral primary osteoarthritis, right hip: Secondary | ICD-10-CM | POA: Diagnosis not present

## 2018-08-29 NOTE — Progress Notes (Signed)
Deborah Bray is a 67 y.o. female who presents for annual wellness visit and follow-up on chronic medical conditions.  She has the following concerns:  Doing well on Prolia injections for osteoporosis.  She is also taking a calcium and vitamin D supplement and getting plenty of weightbearing exercises.  History of vitamin D deficiency She is seeing her psychiatrist for depression and stable on sertraline. She is followed by pulmonology for asthma.  Using Symbicort daily.  No recent flares. She refused colonoscopy and agreed to do the Cologuard last year however she did not do this.     Immunization History  Administered Date(s) Administered  . Hepatitis A 08/05/1999, 02/07/2000  . Hepatitis B 08/05/1999, 09/06/1999, 02/07/2000  . Influenza Whole 08/22/2010  . Influenza, High Dose Seasonal PF 08/28/2015  . Influenza,inj,Quad PF,6+ Mos 09/11/2014  . OPV 08/05/1999  . Pneumococcal Conjugate-13 08/28/2015  . Pneumococcal Polysaccharide-23 01/23/2017  . Td 11/17/2000   Last Pap smear: due in December. Has OB/GYN  Last mammogram:  20 years ago- declines Last colonoscopy: never- declines Last DEXA: 2018 Dentist: twice a year usually Ophtho: Dr. Idolina Primer Exercise: jogs and walk 2-miles a day.   Lives alone with her dog. 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   Other doctors caring for patient include: Dermatology- Dermatology Specialist Gynecology- Dr. Terie Purser Allergy- Dr. Doneta Public asthma  Dr. Toy Care- taking Sertraline  Dr. Hal Morales- murphy wainer- hip pain  Depression screen:  See questionnaire below.  Depression screen [facility] 2/9 08/30/2018 01/23/2017 08/28/2015  Decreased Interest 0 1 0  Down, Depressed, Hopeless 0 1 0  PHQ - 2 Score 0 2 0  Altered sleeping - 3 -  Change in appetite - 0 -  Feeling bad or failure about yourself  - 0 -  Trouble concentrating - 0 -  Moving slowly or fidgety/restless - 0 -  Suicidal thoughts - 0 -   PHQ-9 Score - 5 -  Difficult doing work/chores - Not difficult at all -    Fall Risk Screen: see questionnaire below. Fall Risk  08/30/2018 01/23/2017 08/28/2015  Falls in the past year? Yes No Yes  Comment - - tripped over dog toy.  Number falls in past yr: 1 - 1  Injury with Fall? Yes - No  Risk for fall due to : Other (Comment) - -    ADL screen:  See questionnaire below Functional Status Survey: Is the patient deaf or have difficulty hearing?: No Does the patient have difficulty seeing, even when wearing glasses/contacts?: No Does the patient have difficulty concentrating, remembering, or making decisions?: No Does the patient have difficulty walking or climbing stairs?: No Does the patient have difficulty dressing or bathing?: No Does the patient have difficulty doing errands alone such as visiting a doctor's office or shopping?: No   End of Life Discussion:  Patient does not have a living will and medical power of attorney. Her niece Deborah Bray, who is at [employer] in medical school. She is her HCPOA.   Review of Systems Constitutional: -fever, -chills, -sweats, -unexpected weight change, -anorexia, -fatigue Allergy: -sneezing, -itching, -congestion Dermatology: denies changing moles, rash, lumps, new worrisome lesions ENT: -runny nose, -ear pain, -sore throat, -hoarseness, -sinus pain, -teeth pain, -tinnitus, -hearing loss, -epistaxis Cardiology:  -chest pain, -palpitations, -edema, -orthopnea, -paroxysmal nocturnal dyspnea Respiratory: -cough, -shortness of breath, -dyspnea on exertion, -wheezing, -hemoptysis Gastroenterology: -abdominal pain, -nausea, -vomiting, -diarrhea, -constipation, -blood in stool, -changes in bowel movement, -dysphagia Hematology: -  bleeding or bruising problems Musculoskeletal: -arthralgias, -myalgias, -joint swelling, -back pain, -neck pain, -cramping, -gait changes Ophthalmology: -vision changes, -eye redness, -itching, -discharge Urology:  -dysuria, -difficulty urinating, -hematuria, -urinary frequency, -urgency, incontinence Neurology: -headache, -weakness, -tingling, -numbness, -speech abnormality, -memory loss, -falls, -dizziness Psychology:  -depressed mood, -agitation, -sleep problems    PHYSICAL EXAM:  BP 120/80   Pulse 71   Ht 5\' 3"  (1.6 m)   Wt 109 lb 6.4 oz (49.6 kg)   BMI 19.38 kg/m   General Appearance: Alert, cooperative, no distress, appears stated age Head: Normocephalic, without obvious abnormality, atraumatic Eyes: PERRL, conjunctiva/corneas clear, EOM's intact, fundi benign Ears: Normal TM's and external ear canals Nose: Nares normal, mucosa normal, no drainage or sinus   tenderness Throat: Lips, mucosa, and tongue normal; teeth and gums normal Neck: Supple, no lymphadenopathy; thyroid: no enlargement/tenderness/nodules; no carot Back: Spine nontender, no curvature, ROM normal, no CVA tenderness Lungs: Clear to auscultation bilaterally without wheezes, rales or ronchi; respirations unlabored Chest Wall: No tenderness or deformity Heart: Regular rate and rhythm, S1 and S2 normal, no murmur, rub or gallop Breast Exam: OB/GYN Abdomen: Soft, non-tender, nondistended, normoactive bowel sounds, no masses, no hepatosplenomegaly Genitalia: OB/GYN  Extremities: No clubbing, cyanosis or edema Pulses: 2+ and symmetric all extremities Skin: Skin color, texture, turgor normal, no rashes or lesions Lymph nodes: Cervical, supraclavicular, and axillary nodes normal Neurologic: CNII-XII intact, normal strength, sensation and gait; reflexes 2+ and symmetric throughout Psych: Normal mood, affect, hygiene and grooming.  ASSESSMENT/PLAN: Medicare annual wellness visit, subsequent  Age-related osteoporosis without current pathological fracture  Elevated LDL cholesterol level - Plan: Lipid panel  Screen for colon cancer - Plan: Cologuard  Mammogram declined  Needs flu shot - Plan: Flu vaccine HIGH DOSE  PF  Routine general medical examination at a health care facility - Plan: CBC with Differential/Platelet, Comprehensive metabolic panel, POCT Urinalysis DIP (Proadvantage Device), TSH  Vitamin D deficiency - Plan: VITAMIN D 25 Hydroxy (Vit-D Deficiency, Fractures)  Medication management - Plan: VITAMIN D 25 Hydroxy (Vit-D Deficiency, Fractures)  Advance directive discussed with patient  Moderate persistent asthma without complication  She appears to be doing well physically and emotionally. Followed by Dr. Harold Hedge for asthma.  Followed by Dr. Toy Care for depression.  Her mood is good today. She refuses colonoscopy but does agree to doing the Cologuard.  However, I ordered this for her last year and she did not follow through with it.  She agrees to do it so we will reorder this. She refuses to have a mammogram and states that if she were to find she breast cancer that she would not choose to treat it. She does see her OB/GYN Osteoporosis-she is up-to-date on taking Prolia and doing well on this.  No side effects. Vitamin D deficiency and taking a supplement recheck vitamin D level History of elevated LDL-counseling done on healthy diet.  Check fasting lipids Counseling done on advanced directives and paperwork was provided for her to take home and she will return if she would like to fill this out. Immunizations reviewed and discussed with patient. Tdap prescription given.  Follow-up pending labs   Discussed monthly self breast exams and yearly mammograms; at least 30 minutes of aerobic activity at least 5 days/week and weight-bearing exercise 2x/week; proper sunscreen use reviewed; healthy diet, including goals of calcium and vitamin D intake and alcohol recommendations (less than or equal to 1 drink/day) reviewed; regular seatbelt use; changing batteries in smoke detectors.  Immunization recommendations discussed.  Colonoscopy recommendations reviewed   Medicare Attestation I have  personally reviewed: The patient's medical and social history Their use of alcohol, tobacco or illicit drugs Their current medications and supplements The patient's functional ability including ADLs,fall risks, home safety risks, cognitive, and hearing and visual impairment Diet and physical activities Evidence for depression or mood disorders  The patient's weight, height, and BMI have been recorded in the chart.  I have made referrals, counseling, and provided education to the patient based on review of the above and I have provided the patient with a written personalized care plan for preventive services.     Harland Dingwall, NP-C   08/31/2018

## 2018-08-30 ENCOUNTER — Encounter: Payer: Self-pay | Admitting: Family Medicine

## 2018-08-30 ENCOUNTER — Ambulatory Visit (INDEPENDENT_AMBULATORY_CARE_PROVIDER_SITE_OTHER): Payer: PPO | Admitting: Family Medicine

## 2018-08-30 VITALS — BP 120/80 | HR 71 | Ht 63.0 in | Wt 109.4 lb

## 2018-08-30 DIAGNOSIS — M810 Age-related osteoporosis without current pathological fracture: Secondary | ICD-10-CM

## 2018-08-30 DIAGNOSIS — Z1211 Encounter for screening for malignant neoplasm of colon: Secondary | ICD-10-CM

## 2018-08-30 DIAGNOSIS — Z0000 Encounter for general adult medical examination without abnormal findings: Secondary | ICD-10-CM | POA: Diagnosis not present

## 2018-08-30 DIAGNOSIS — E559 Vitamin D deficiency, unspecified: Secondary | ICD-10-CM | POA: Diagnosis not present

## 2018-08-30 DIAGNOSIS — Z79899 Other long term (current) drug therapy: Secondary | ICD-10-CM | POA: Diagnosis not present

## 2018-08-30 DIAGNOSIS — E7800 Pure hypercholesterolemia, unspecified: Secondary | ICD-10-CM | POA: Diagnosis not present

## 2018-08-30 DIAGNOSIS — J4540 Moderate persistent asthma, uncomplicated: Secondary | ICD-10-CM

## 2018-08-30 DIAGNOSIS — Z7189 Other specified counseling: Secondary | ICD-10-CM

## 2018-08-30 DIAGNOSIS — Z23 Encounter for immunization: Secondary | ICD-10-CM | POA: Diagnosis not present

## 2018-08-30 DIAGNOSIS — Z5320 Procedure and treatment not carried out because of patient's decision for unspecified reasons: Secondary | ICD-10-CM

## 2018-08-30 NOTE — Patient Instructions (Signed)
You can take the Tdap prescription to your pharmacy.  You should receive the cologuard kit in the mail. Please do this and send it back.   We will call you with your lab results.     Preventative Care for Adults - Female      MAINTAIN REGULAR HEALTH EXAMS:  A routine yearly physical is a good way to check in with your primary care provider about your health and preventive screening. It is also an opportunity to share updates about your health and any concerns you have, and receive a thorough all-over exam.   Most health insurance companies pay for at least some preventative services.  Check with your health plan for specific coverages.  WHAT PREVENTATIVE SERVICES DO WOMEN NEED?  Adult women should have their weight and blood pressure checked regularly.   Women age 33 and older should have their cholesterol levels checked regularly.  Women should be screened for cervical cancer with a Pap smear and pelvic exam beginning at either age 25, or 3 years after they become sexually activity.    Breast cancer screening generally begins at age 61 with a mammogram and breast exam by your primary care provider.    Beginning at age 61 and continuing to age 70, women should be screened for colorectal cancer.  Certain people may need continued testing until age 71.  Updating vaccinations is part of preventative care.  Vaccinations help protect against diseases such as the flu.  Osteoporosis is a disease in which the bones lose minerals and strength as we age. Women ages 50 and over should discuss this with their caregivers, as should women after menopause who have other risk factors.  Lab tests are generally done as part of preventative care to screen for anemia and blood disorders, to screen for problems with the kidneys and liver, to screen for bladder problems, to check blood sugar, and to check your cholesterol level.  Preventative services generally include counseling about diet, exercise,  avoiding tobacco, drugs, excessive alcohol consumption, and sexually transmitted infections.    GENERAL RECOMMENDATIONS FOR GOOD HEALTH:  Healthy diet:  Eat a variety of foods, including fruit, vegetables, animal or vegetable protein, such as meat, fish, chicken, and eggs, or beans, lentils, tofu, and grains, such as rice.  Drink plenty of water daily.  Decrease saturated fat in the diet, avoid lots of red meat, processed foods, sweets, fast foods, and fried foods.  Exercise:  Aerobic exercise helps maintain good heart health. At least 30-40 minutes of moderate-intensity exercise is recommended. For example, a brisk walk that increases your heart rate and breathing. This should be done on most days of the week.   Find a type of exercise or a variety of exercises that you enjoy so that it becomes a part of your daily life.  Examples are running, walking, swimming, water aerobics, and biking.  For motivation and support, explore group exercise such as aerobic class, spin class, Zumba, Yoga,or  martial arts, etc.    Set exercise goals for yourself, such as a certain weight goal, walk or run in a race such as a 5k walk/run.  Speak to your primary care provider about exercise goals.  Disease prevention:  If you smoke or chew tobacco, find out from your caregiver how to quit. It can literally save your life, no matter how long you have been a tobacco user. If you do not use tobacco, never begin.   Maintain a healthy diet and normal weight. Increased  weight leads to problems with blood pressure and diabetes.   The Body Mass Index or BMI is a way of measuring how much of your body is fat. Having a BMI above 27 increases the risk of heart disease, diabetes, hypertension, stroke and other problems related to obesity. Your caregiver can help determine your BMI and based on it develop an exercise and dietary program to help you achieve or maintain this important measurement at a healthful  level.  High blood pressure causes heart and blood vessel problems.  Persistent high blood pressure should be treated with medicine if weight loss and exercise do not work.   Fat and cholesterol leaves deposits in your arteries that can block them. This causes heart disease and vessel disease elsewhere in your body.  If your cholesterol is found to be high, or if you have heart disease or certain other medical conditions, then you may need to have your cholesterol monitored frequently and be treated with medication.   Ask if you should have a cardiac stress test if your history suggests this. A stress test is a test done on a treadmill that looks for heart disease. This test can find disease prior to there being a problem.  Menopause can be associated with physical symptoms and risks. Hormone replacement therapy is available to decrease these. You should talk to your caregiver about whether starting or continuing to take hormones is right for you.   Osteoporosis is a disease in which the bones lose minerals and strength as we age. This can result in serious bone fractures. Risk of osteoporosis can be identified using a bone density scan. Women ages 93 and over should discuss this with their caregivers, as should women after menopause who have other risk factors. Ask your caregiver whether you should be taking a calcium supplement and Vitamin D, to reduce the rate of osteoporosis.   Avoid drinking alcohol in excess (more than two drinks per day).  Avoid use of street drugs. Do not share needles with anyone. Ask for professional help if you need assistance or instructions on stopping the use of alcohol, cigarettes, and/or drugs.  Brush your teeth twice a day with fluoride toothpaste, and floss once a day. Good oral hygiene prevents tooth decay and gum disease. The problems can be painful, unattractive, and can cause other health problems. Visit your dentist for a routine oral and dental check up and  preventive care every 6-12 months.   Look at your skin regularly.  Use a mirror to look at your back. Notify your caregivers of changes in moles, especially if there are changes in shapes, colors, a size larger than a pencil eraser, an irregular border, or development of new moles.  Safety:  Use seatbelts 100% of the time, whether driving or as a passenger.  Use safety devices such as hearing protection if you work in environments with loud noise or significant background noise.  Use safety glasses when doing any work that could send debris in to the eyes.  Use a helmet if you ride a bike or motorcycle.  Use appropriate safety gear for contact sports.  Talk to your caregiver about gun safety.  Use sunscreen with a SPF (or skin protection factor) of 15 or greater.  Lighter skinned people are at a greater risk of skin cancer. Don't forget to also wear sunglasses in order to protect your eyes from too much damaging sunlight. Damaging sunlight can accelerate cataract formation.   Practice safe sex. Use condoms.  Condoms are used for birth control and to help reduce the spread of sexually transmitted infections (or STIs).  Some of the STIs are gonorrhea (the clap), chlamydia, syphilis, trichomonas, herpes, HPV (human papilloma virus) and HIV (human immunodeficiency virus) which causes AIDS. The herpes, HIV and HPV are viral illnesses that have no cure. These can result in disability, cancer and death.   Keep carbon monoxide and smoke detectors in your home functioning at all times. Change the batteries every 6 months or use a model that plugs into the wall.   Vaccinations:  Stay up to date with your tetanus shots and other required immunizations. You should have a booster for tetanus every 10 years. Be sure to get your flu shot every year, since 5%-20% of the [place] population comes down with the flu. The flu vaccine changes each year, so being vaccinated once is not enough. Get your shot in the fall, before  the flu season peaks.   Other vaccines to consider:  Human Papilloma Virus or HPV causes cancer of the cervix, and other infections that can be transmitted from person to person. There is a vaccine for HPV, and females should get immunized between the ages of 22 and 42. It requires a series of 3 shots.   Pneumococcal vaccine to protect against certain types of pneumonia.  This is normally recommended for adults age 70 or older.  However, adults younger than 69 years old with certain underlying conditions such as diabetes, heart or lung disease should also receive the vaccine.  Shingles vaccine to protect against Varicella Zoster if you are older than age 65, or younger than 69 years old with certain underlying illness.  Hepatitis A vaccine to protect against a form of infection of the liver by a virus acquired from food.  Hepatitis B vaccine to protect against a form of infection of the liver by a virus acquired from blood or body fluids, particularly if you work in health care.  If you plan to travel internationally, check with your local health department for specific vaccination recommendations.  Cancer Screening:  Breast cancer screening is essential to preventive care for women. All women age 71 and older should perform a breast self-exam every month. At age 13 and older, women should have their caregiver complete a breast exam each year. Women at ages 69 and older should have a mammogram (x-ray film) of the breasts. Your caregiver can discuss how often you need mammograms.    Cervical cancer screening includes taking a Pap smear (sample of cells examined under a microscope) from the cervix (end of the uterus). It also includes testing for HPV (Human Papilloma Virus, which can cause cervical cancer). Screening and a pelvic exam should begin at age 29, or 3 years after a woman becomes sexually active. Screening should occur every year, with a Pap smear but no HPV testing, up to age 59. After  age 60, you should have a Pap smear every 3 years with HPV testing, if no HPV was found previously.   Most routine colon cancer screening begins at the age of 64. On a yearly basis, doctors may provide special easy to use take-home tests to check for hidden blood in the stool. Sigmoidoscopy or colonoscopy can detect the earliest forms of colon cancer and is life saving. These tests use a small camera at the end of a tube to directly examine the colon. Speak to your caregiver about this at age 33, when routine screening begins (and is  repeated every 5 years unless early forms of pre-cancerous polyps or small growths are found).

## 2018-08-31 DIAGNOSIS — Z23 Encounter for immunization: Secondary | ICD-10-CM | POA: Diagnosis not present

## 2018-08-31 LAB — COMPREHENSIVE METABOLIC PANEL
ALBUMIN: 4.9 g/dL — AB (ref 3.6–4.8)
ALK PHOS: 48 IU/L (ref 39–117)
ALT: 16 IU/L (ref 0–32)
AST: 25 IU/L (ref 0–40)
Albumin/Globulin Ratio: 1.8 (ref 1.2–2.2)
BUN / CREAT RATIO: 30 — AB (ref 12–28)
BUN: 28 mg/dL — AB (ref 8–27)
Bilirubin Total: 0.4 mg/dL (ref 0.0–1.2)
CALCIUM: 10.2 mg/dL (ref 8.7–10.3)
CO2: 27 mmol/L (ref 20–29)
Chloride: 96 mmol/L (ref 96–106)
Creatinine, Ser: 0.93 mg/dL (ref 0.57–1.00)
GFR calc Af Amer: 73 mL/min/{1.73_m2} (ref >59)
GFR, EST NON AFRICAN AMERICAN: 63 mL/min/{1.73_m2} (ref >59)
GLOBULIN, TOTAL: 2.7 g/dL (ref 1.5–4.5)
GLUCOSE: 81 mg/dL (ref 65–99)
Potassium: 5.3 mmol/L — ABNORMAL HIGH (ref 3.5–5.2)
Sodium: 139 mmol/L (ref 134–144)
Total Protein: 7.6 g/dL (ref 6.0–8.5)

## 2018-08-31 LAB — CBC WITH DIFFERENTIAL/PLATELET
BASOS: 1 %
Basophils Absolute: 0 10*3/uL (ref 0.0–0.2)
EOS (ABSOLUTE): 0.1 10*3/uL (ref 0.0–0.4)
Eos: 2 %
Hematocrit: 43.6 % (ref 34.0–46.6)
Hemoglobin: 14.6 g/dL (ref 11.1–15.9)
Immature Grans (Abs): 0 10*3/uL (ref 0.0–0.1)
Immature Granulocytes: 0 %
Lymphocytes Absolute: 2 10*3/uL (ref 0.7–3.1)
Lymphs: 32 %
MCH: 31.9 pg (ref 26.6–33.0)
MCHC: 33.5 g/dL (ref 31.5–35.7)
MCV: 95 fL (ref 79–97)
MONOS ABS: 0.7 10*3/uL (ref 0.1–0.9)
Monocytes: 11 %
NEUTROS ABS: 3.3 10*3/uL (ref 1.4–7.0)
Neutrophils: 54 %
Platelets: 263 10*3/uL (ref 150–450)
RBC: 4.58 x10E6/uL (ref 3.77–5.28)
RDW: 12 % — ABNORMAL LOW (ref 12.3–15.4)
WBC: 6.1 10*3/uL (ref 3.4–10.8)

## 2018-08-31 LAB — POCT URINALYSIS DIP (PROADVANTAGE DEVICE)
Bilirubin, UA: NEGATIVE
Blood, UA: NEGATIVE
GLUCOSE UA: NEGATIVE mg/dL
Ketones, POC UA: NEGATIVE mg/dL
LEUKOCYTES UA: NEGATIVE
Nitrite, UA: NEGATIVE
PROTEIN UA: NEGATIVE mg/dL
Specific Gravity, Urine: 1.03
UUROB: NEGATIVE
pH, UA: 6 (ref 5.0–8.0)

## 2018-08-31 LAB — LIPID PANEL
Chol/HDL Ratio: 1.6 ratio (ref 0.0–4.4)
Cholesterol, Total: 324 mg/dL — ABNORMAL HIGH (ref 100–199)
HDL: 198 mg/dL (ref >39)
LDL Calculated: 114 mg/dL — ABNORMAL HIGH (ref 0–99)
TRIGLYCERIDES: 62 mg/dL (ref 0–149)
VLDL CHOLESTEROL CAL: 12 mg/dL (ref 5–40)

## 2018-08-31 LAB — VITAMIN D 25 HYDROXY (VIT D DEFICIENCY, FRACTURES): Vit D, 25-Hydroxy: 53.5 ng/mL (ref 30.0–100.0)

## 2018-08-31 LAB — TSH: TSH: 2.38 u[IU]/mL (ref 0.450–4.500)

## 2018-09-01 ENCOUNTER — Telehealth: Payer: Self-pay | Admitting: Internal Medicine

## 2018-09-01 NOTE — Telephone Encounter (Signed)
Pt called and states she got an automatic call from [place]. I did not call her but noticied you did not result her labs. Please advise

## 2018-09-02 ENCOUNTER — Encounter: Payer: Self-pay | Admitting: Family Medicine

## 2018-09-02 NOTE — Telephone Encounter (Signed)
I am not sure what happened but let her know that her labs are fine. Her potassium was just marginally elevated at a tenth of a point higher than normal. This may just be a lab error and nothing to be concerned about. We can always recheck this in a week or so to make sure it is not continuing to be elevated if she prefers but not required. . Her vitamin D is normal as well as her thyroid function. Her HDL or good cholesterol is very high which is a protectant for heart disease.

## 2018-09-02 NOTE — Telephone Encounter (Signed)
Left message for pt to call me back 

## 2018-09-03 NOTE — Telephone Encounter (Signed)
Patient has been advised of providers note

## 2018-09-08 DIAGNOSIS — M25551 Pain in right hip: Secondary | ICD-10-CM | POA: Diagnosis not present

## 2018-09-14 DIAGNOSIS — M5136 Other intervertebral disc degeneration, lumbar region: Secondary | ICD-10-CM | POA: Diagnosis not present

## 2018-09-14 DIAGNOSIS — M25551 Pain in right hip: Secondary | ICD-10-CM | POA: Diagnosis not present

## 2018-09-25 DIAGNOSIS — M545 Low back pain: Secondary | ICD-10-CM | POA: Diagnosis not present

## 2018-10-06 DIAGNOSIS — M48061 Spinal stenosis, lumbar region without neurogenic claudication: Secondary | ICD-10-CM | POA: Diagnosis not present

## 2018-10-06 DIAGNOSIS — M25551 Pain in right hip: Secondary | ICD-10-CM | POA: Diagnosis not present

## 2018-10-26 DIAGNOSIS — M25551 Pain in right hip: Secondary | ICD-10-CM | POA: Diagnosis not present

## 2018-10-26 DIAGNOSIS — M5416 Radiculopathy, lumbar region: Secondary | ICD-10-CM | POA: Diagnosis not present

## 2018-10-26 DIAGNOSIS — M545 Low back pain: Secondary | ICD-10-CM | POA: Diagnosis not present

## 2018-11-04 ENCOUNTER — Telehealth: Payer: Self-pay | Admitting: Internal Medicine

## 2018-11-04 NOTE — Telephone Encounter (Signed)
Called and left detailed message for pt to call back to let us know if she was going to do the cologuard as it has not been sent in yet.

## 2018-11-05 DIAGNOSIS — M545 Low back pain: Secondary | ICD-10-CM | POA: Diagnosis not present

## 2018-11-05 DIAGNOSIS — M5416 Radiculopathy, lumbar region: Secondary | ICD-10-CM | POA: Diagnosis not present

## 2018-11-11 ENCOUNTER — Encounter: Payer: Self-pay | Admitting: Family Medicine

## 2018-11-24 ENCOUNTER — Telehealth: Payer: Self-pay | Admitting: Family Medicine

## 2018-11-24 NOTE — Telephone Encounter (Signed)
Left message for pt to call. Need to verify insurance for 2020 for prolia. Also pt had a appt for that on 01/13/2019. I canceled that appt due to date being too early for insurance to pay.

## 2018-12-07 DIAGNOSIS — M5416 Radiculopathy, lumbar region: Secondary | ICD-10-CM | POA: Diagnosis not present

## 2018-12-07 DIAGNOSIS — M545 Low back pain: Secondary | ICD-10-CM | POA: Diagnosis not present

## 2018-12-27 ENCOUNTER — Telehealth: Payer: Self-pay | Admitting: Family Medicine

## 2018-12-27 NOTE — Telephone Encounter (Signed)
Left message for pt to call me back concerning her next prolia injection. Needs to be scheduled for after 01/13/2019. Please advise Kat when schedule. Pt's estimated out of pocket cost will be $23.00.

## 2018-12-30 ENCOUNTER — Encounter: Payer: Self-pay | Admitting: Family Medicine

## 2018-12-30 ENCOUNTER — Ambulatory Visit (INDEPENDENT_AMBULATORY_CARE_PROVIDER_SITE_OTHER): Payer: PPO | Admitting: Family Medicine

## 2018-12-30 VITALS — BP 120/70 | HR 78 | Temp 98.7°F | Resp 16 | Wt 112.2 lb

## 2018-12-30 DIAGNOSIS — J209 Acute bronchitis, unspecified: Secondary | ICD-10-CM | POA: Diagnosis not present

## 2018-12-30 DIAGNOSIS — J45909 Unspecified asthma, uncomplicated: Secondary | ICD-10-CM | POA: Diagnosis not present

## 2018-12-30 MED ORDER — AZITHROMYCIN 250 MG PO TABS: ORAL_TABLET | ORAL | 0 refills | Status: DC

## 2018-12-30 MED ORDER — PREDNISONE 10 MG (21) PO TBPK: ORAL_TABLET | Freq: Every day | ORAL | 0 refills | Status: DC

## 2018-12-30 NOTE — Progress Notes (Signed)
Chief Complaint  Patient presents with  . cough    cough for a week, some sob. using athma medicine    Subjective:  Deborah Bray is a 68 y.o. female who presents for possible bronchitis.  Symptoms include a one week history of chest congestion and cough.  Feels like she is on the verge of an asthma flare.  Having some chest tightness.  States Mucinex seems to have helped loosen her cough up today.  Denies fever, chills, palpitations, abdominal pain, nausea, vomiting, diarrhea.  She is treating her asthma with Symbicort daily and has been needing her albuterol more often.  Treatment to date: Mucinex, and her usual asthma medications .  Positive sick contacts.   She does not smoke.   She does have a history of bronchitis.   No other aggravating or relieving factors.  No other c/o.  The following portions of the patient's history were reviewed and updated as appropriate: allergies, current medications, past family history, past medical history, past social history, past surgical history and problem list.  ROS as in subjective  Past Medical History:  Diagnosis Date  . Asthma    allergy induced  . CIN I (cervical intraepithelial neoplasia I)   . CIN III (cervical intraepithelial neoplasia III)   . Depression   . Elevated cholesterol   . Encounter for screening for lung cancer 12/08/2017  . HEADACHE 03/07/2008   Qualifier: Diagnosis of  By: Sherlynn Stalls, CMA, [place]    . Nasal fracture   . Osteoporosis      Objective: Vital signs reviewed BP 120/70   Pulse 78   Temp 98.7 F (37.1 C) (Oral)   Resp 16   Wt 112 lb 3.2 oz (50.9 kg)   SpO2 98%   BMI 19.88 kg/m    General appearance: Alert, WD/WN, no distress, ill appearing                             Skin: warm, no rash, no diaphoresis                           Head: no sinus tenderness                            Eyes: conjunctiva normal, corneas clear, PERRLA                            Ears: pearly TMs, external ear canals  normal                          Nose: septum midline, turbinates swollen, with erythema and clear discharge             Mouth/throat: MMM, tongue normal, mild pharyngeal erythema                           Neck: supple, no adenopathy, no thyromegaly, nontender                          Heart: RRR, normal S1, S2, no murmurs                         Lungs: +bronchial breath sounds, +scattered rhonchi, no wheezes,  no rales                Extremities: no edema, nontender      Assessment: Bronchitis with asthma, acute - Plan: predniSONE (STERAPRED UNI-PAK 21 TAB) 10 MG (21) TBPK tablet, azithromycin (ZITHROMAX Z-PAK) 250 MG tablet     Plan:  Z-pak and prednisone dose pack sent to pharmacy. She will start the antibiotic and hold off on the steroid.  If she worsens over the weekend she was started on a steroid.  Continue with Symbicort and use albuterol as needed.    Discussed diagnosis and treatment of bronchitis.  Suggested symptomatic OTC remedies for cough and congestion.  Tylenol or Ibuprofen OTC for fever and malaise.  Call/return if symptoms are worse or not improving.  Advised that cough may linger even after the infection is improved.
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
## 2019-01-06 ENCOUNTER — Encounter: Payer: Self-pay | Admitting: Family Medicine

## 2019-01-07 ENCOUNTER — Telehealth: Payer: Self-pay | Admitting: Family Medicine

## 2019-01-07 NOTE — Telephone Encounter (Signed)
Pt called and stated that she has finished the medication but is still coughing. Pt is requesting something be sent in for her. Pt uses [facility] on [address] and can be reached at [phone].

## 2019-01-10 NOTE — Telephone Encounter (Signed)
Left message for pt to call back  °

## 2019-01-10 NOTE — Telephone Encounter (Signed)
Please let her know that I was not in the office Friday afternoon when her call came in. Find out how she is doing please? Any new symptoms? Cough productive? What is she currently taking?

## 2019-01-10 NOTE — Telephone Encounter (Signed)
Left message for pt to call me back 

## 2019-01-11 NOTE — Telephone Encounter (Signed)
Spoke to patient, she stated she stills has a deep chest cough that's productive. No new symptoms. She is taking Mucinex and it's not helping the cough

## 2019-01-11 NOTE — Telephone Encounter (Signed)
Left message for pt to call me back 

## 2019-01-11 NOTE — Telephone Encounter (Signed)
Please call and find out if she took the steroid.  If not then I would recommend doing this.  If she did take it and is still not any better than months prescribe her another antibiotic and have her follow-up if not back to baseline after completing it. Can she take Augmentin?  If she does not have an allergy to this then please prescribe it twice daily x10 days.  #20, no refills.

## 2019-01-12 NOTE — Telephone Encounter (Signed)
Left message for pt to call me back 

## 2019-01-13 ENCOUNTER — Other Ambulatory Visit: Payer: PPO

## 2019-01-18 ENCOUNTER — Other Ambulatory Visit (INDEPENDENT_AMBULATORY_CARE_PROVIDER_SITE_OTHER): Payer: PPO

## 2019-01-18 DIAGNOSIS — M810 Age-related osteoporosis without current pathological fracture: Secondary | ICD-10-CM

## 2019-01-18 MED ORDER — DENOSUMAB 60 MG/ML ~~LOC~~ SOSY
60.0000 mg | PREFILLED_SYRINGE | Freq: Once | SUBCUTANEOUS | Status: AC
  Administered 2019-01-18: 60 mg via SUBCUTANEOUS

## 2019-04-04 DIAGNOSIS — M5416 Radiculopathy, lumbar region: Secondary | ICD-10-CM | POA: Diagnosis not present

## 2019-04-04 DIAGNOSIS — M545 Low back pain: Secondary | ICD-10-CM | POA: Diagnosis not present

## 2019-04-06 DIAGNOSIS — M5416 Radiculopathy, lumbar region: Secondary | ICD-10-CM | POA: Diagnosis not present

## 2019-04-06 DIAGNOSIS — M545 Low back pain: Secondary | ICD-10-CM | POA: Diagnosis not present

## 2019-04-21 DIAGNOSIS — M25551 Pain in right hip: Secondary | ICD-10-CM | POA: Diagnosis not present

## 2019-04-25 DIAGNOSIS — M545 Low back pain: Secondary | ICD-10-CM | POA: Diagnosis not present

## 2019-04-25 DIAGNOSIS — M5416 Radiculopathy, lumbar region: Secondary | ICD-10-CM | POA: Diagnosis not present

## 2019-04-25 DIAGNOSIS — M48061 Spinal stenosis, lumbar region without neurogenic claudication: Secondary | ICD-10-CM | POA: Diagnosis not present

## 2019-05-06 DIAGNOSIS — D485 Neoplasm of uncertain behavior of skin: Secondary | ICD-10-CM | POA: Diagnosis not present

## 2019-05-06 DIAGNOSIS — L570 Actinic keratosis: Secondary | ICD-10-CM | POA: Diagnosis not present

## 2019-05-06 DIAGNOSIS — C44529 Squamous cell carcinoma of skin of other part of trunk: Secondary | ICD-10-CM | POA: Diagnosis not present

## 2019-05-06 DIAGNOSIS — L309 Dermatitis, unspecified: Secondary | ICD-10-CM | POA: Diagnosis not present

## 2019-05-09 DIAGNOSIS — M7061 Trochanteric bursitis, right hip: Secondary | ICD-10-CM | POA: Diagnosis not present

## 2019-06-08 DIAGNOSIS — M7061 Trochanteric bursitis, right hip: Secondary | ICD-10-CM | POA: Diagnosis not present

## 2019-06-16 DIAGNOSIS — C44629 Squamous cell carcinoma of skin of left upper limb, including shoulder: Secondary | ICD-10-CM | POA: Diagnosis not present

## 2019-06-16 DIAGNOSIS — D485 Neoplasm of uncertain behavior of skin: Secondary | ICD-10-CM | POA: Diagnosis not present

## 2019-06-16 DIAGNOSIS — C44529 Squamous cell carcinoma of skin of other part of trunk: Secondary | ICD-10-CM | POA: Diagnosis not present

## 2019-07-05 ENCOUNTER — Telehealth: Payer: Self-pay | Admitting: Family Medicine

## 2019-07-05 NOTE — Telephone Encounter (Signed)
Left message for pt to call me back. Need to discuss pt's next prolia injection.

## 2019-07-11 ENCOUNTER — Telehealth: Payer: Self-pay | Admitting: Family Medicine

## 2019-07-11 NOTE — Telephone Encounter (Signed)
Pt was called concerning her next Prolia injection. Pt was informed that [employer] was now closed for additional monies. She has relied on these funds for previous injections. Her out of pocket cost is $248. She states this is not affordable. Pt was informed that I can hold her info and try to reapply in the new year.  Sending this to Vickie to inform her the pt is no longer taking Prolia and other measures need to take place. If Vickie would like me too I can try application in the new year.

## 2019-07-11 NOTE — Telephone Encounter (Signed)
Left message for pt to call me back 

## 2019-07-11 NOTE — Telephone Encounter (Signed)
I am sorry to hear that Prolia is no longer an option. We can try her on an oral bisphosphonate such as Fosamax or the generic alendronate if there are no contraindications. Let's do a virtual or in office visit to discuss this. It has also been more than 2 years since her last bone density test and we can do another one to see if the medication has helped and how her bones look at this point.

## 2019-07-12 NOTE — Telephone Encounter (Signed)
Left message for pt to call back  °

## 2019-07-13 NOTE — Telephone Encounter (Signed)
Left message for pt to call back  °

## 2019-07-15 DIAGNOSIS — J4540 Moderate persistent asthma, uncomplicated: Secondary | ICD-10-CM | POA: Diagnosis not present

## 2019-07-15 DIAGNOSIS — J3081 Allergic rhinitis due to animal (cat) (dog) hair and dander: Secondary | ICD-10-CM | POA: Diagnosis not present

## 2019-07-15 DIAGNOSIS — J3089 Other allergic rhinitis: Secondary | ICD-10-CM | POA: Diagnosis not present

## 2019-07-15 DIAGNOSIS — J301 Allergic rhinitis due to pollen: Secondary | ICD-10-CM | POA: Diagnosis not present

## 2019-07-18 NOTE — Telephone Encounter (Signed)
Pt is going to do a virtual on thursday

## 2019-07-18 NOTE — Telephone Encounter (Signed)
Left message for pt to call me back 

## 2019-07-20 DIAGNOSIS — M7061 Trochanteric bursitis, right hip: Secondary | ICD-10-CM | POA: Diagnosis not present

## 2019-07-21 ENCOUNTER — Encounter: Payer: Self-pay | Admitting: Family Medicine

## 2019-07-21 ENCOUNTER — Ambulatory Visit (INDEPENDENT_AMBULATORY_CARE_PROVIDER_SITE_OTHER): Payer: PPO | Admitting: Family Medicine

## 2019-07-21 ENCOUNTER — Other Ambulatory Visit: Payer: Self-pay

## 2019-07-21 VITALS — Wt 111.0 lb

## 2019-07-21 DIAGNOSIS — Z79899 Other long term (current) drug therapy: Secondary | ICD-10-CM | POA: Diagnosis not present

## 2019-07-21 DIAGNOSIS — M810 Age-related osteoporosis without current pathological fracture: Secondary | ICD-10-CM | POA: Diagnosis not present

## 2019-07-21 NOTE — Progress Notes (Signed)
   Subjective:   Documentation for virtual audio and video telecommunications through [place] encounter:  The patient was located at home. 2 patient identifiers used.  The provider was located in the office. The patient did consent to this visit and is aware of possible charges through their insurance for this visit.  The other persons participating in this telemedicine service were none.     Patient ID: Deborah Bray, female    DOB: November 18, 1950, 68 y.o.   MRN: [id number]  HPI Chief Complaint  Patient presents with  . discuss bone density    bone density treatment and needs order for that     Her concern today is that she has not had Prolia in more than 6 months due to financial issues. She was receiving a grant for Prolia but this is not available currently. She would like to get on a payment plan to get her next injection or try a different medication.  She has never tried an oral bisphosphonate due to having difficulty swallowing. States this started 10 years ago. Food has never gotten stuck. Refuses to see GI for this.  Denies any recent falls or fracture.   She is overdue for repeat DEXA so see if treatment has been effective.   Having issues with lumbar back pain and right hip pain. Seeing orthopedist.   Denies fever, chills, dizziness, chest pain, palpitations, shortness of breath, abdominal pain, N/V/D, urinary symptoms, LE edema.   Reviewed allergies, medications, past medical, surgical, family, and social history.   Review of Systems Pertinent positives and negatives in the history of present illness.     Objective:   Physical Exam Wt 111 lb (50.3 kg)   BMI 19.66 kg/m   Alert and oriented and in no acute distress.       Assessment & Plan:  Age-related osteoporosis without current pathological fracture - Plan: DG Bone Density  Medication management - Plan: DG Bone Density  She will get a Prolia injection and ok for her to pay over the next 6 months. Ok  to continue with every 6 months as long as she completes payment.  She will call and schedule DEXA to evaluate treatment effectiveness.  Time spent on call was 19 minutes and in review of previous records 2 minutes total.  This virtual service is not related to other E/M service within previous 7 days.

## 2019-07-26 ENCOUNTER — Other Ambulatory Visit: Payer: Self-pay

## 2019-07-26 ENCOUNTER — Ambulatory Visit
Admission: RE | Admit: 2019-07-26 | Discharge: 2019-07-26 | Disposition: A | Discharge status: Home / Self Care | Payer: PPO | Source: Ambulatory Visit | Attending: Family Medicine | Admitting: Family Medicine

## 2019-07-26 DIAGNOSIS — M810 Age-related osteoporosis without current pathological fracture: Secondary | ICD-10-CM | POA: Diagnosis not present

## 2019-07-26 DIAGNOSIS — Z780 Asymptomatic menopausal state: Secondary | ICD-10-CM | POA: Diagnosis not present

## 2019-07-26 DIAGNOSIS — Z79899 Other long term (current) drug therapy: Secondary | ICD-10-CM

## 2019-07-27 ENCOUNTER — Encounter: Payer: Self-pay | Admitting: Internal Medicine

## 2019-07-27 DIAGNOSIS — M545 Low back pain: Secondary | ICD-10-CM | POA: Diagnosis not present

## 2019-07-27 DIAGNOSIS — M25551 Pain in right hip: Secondary | ICD-10-CM | POA: Diagnosis not present

## 2019-07-27 DIAGNOSIS — M48061 Spinal stenosis, lumbar region without neurogenic claudication: Secondary | ICD-10-CM | POA: Diagnosis not present

## 2019-08-02 ENCOUNTER — Other Ambulatory Visit: Payer: PPO

## 2019-08-02 ENCOUNTER — Other Ambulatory Visit: Payer: Self-pay

## 2019-08-02 DIAGNOSIS — M810 Age-related osteoporosis without current pathological fracture: Secondary | ICD-10-CM | POA: Diagnosis not present

## 2019-08-02 MED ORDER — DENOSUMAB 60 MG/ML ~~LOC~~ SOSY
60.0000 mg | PREFILLED_SYRINGE | Freq: Once | SUBCUTANEOUS | Status: AC
  Administered 2019-08-02: 60 mg via SUBCUTANEOUS

## 2019-08-08 DIAGNOSIS — M5416 Radiculopathy, lumbar region: Secondary | ICD-10-CM | POA: Diagnosis not present

## 2019-08-08 DIAGNOSIS — M545 Low back pain: Secondary | ICD-10-CM | POA: Diagnosis not present

## 2019-08-16 DIAGNOSIS — C44729 Squamous cell carcinoma of skin of left lower limb, including hip: Secondary | ICD-10-CM | POA: Diagnosis not present

## 2019-08-16 DIAGNOSIS — L814 Other melanin hyperpigmentation: Secondary | ICD-10-CM | POA: Diagnosis not present

## 2019-08-16 DIAGNOSIS — Z23 Encounter for immunization: Secondary | ICD-10-CM | POA: Diagnosis not present

## 2019-08-16 DIAGNOSIS — L820 Inflamed seborrheic keratosis: Secondary | ICD-10-CM | POA: Diagnosis not present

## 2019-08-16 DIAGNOSIS — Z85828 Personal history of other malignant neoplasm of skin: Secondary | ICD-10-CM | POA: Diagnosis not present

## 2019-08-16 DIAGNOSIS — D225 Melanocytic nevi of trunk: Secondary | ICD-10-CM | POA: Diagnosis not present

## 2019-08-16 DIAGNOSIS — Z85820 Personal history of malignant melanoma of skin: Secondary | ICD-10-CM | POA: Diagnosis not present

## 2019-08-16 DIAGNOSIS — L570 Actinic keratosis: Secondary | ICD-10-CM | POA: Diagnosis not present

## 2019-08-16 DIAGNOSIS — D485 Neoplasm of uncertain behavior of skin: Secondary | ICD-10-CM | POA: Diagnosis not present

## 2019-08-16 DIAGNOSIS — L821 Other seborrheic keratosis: Secondary | ICD-10-CM | POA: Diagnosis not present

## 2019-08-22 ENCOUNTER — Encounter: Payer: Self-pay | Admitting: Family Medicine

## 2019-08-22 DIAGNOSIS — M25551 Pain in right hip: Secondary | ICD-10-CM | POA: Diagnosis not present

## 2019-08-22 DIAGNOSIS — M545 Low back pain: Secondary | ICD-10-CM | POA: Diagnosis not present

## 2019-09-02 DIAGNOSIS — M25551 Pain in right hip: Secondary | ICD-10-CM | POA: Diagnosis not present

## 2019-09-02 DIAGNOSIS — M7061 Trochanteric bursitis, right hip: Secondary | ICD-10-CM | POA: Diagnosis not present

## 2019-09-29 DIAGNOSIS — C44729 Squamous cell carcinoma of skin of left lower limb, including hip: Secondary | ICD-10-CM | POA: Diagnosis not present

## 2019-10-03 DIAGNOSIS — M7061 Trochanteric bursitis, right hip: Secondary | ICD-10-CM | POA: Diagnosis not present

## 2019-10-03 DIAGNOSIS — M25551 Pain in right hip: Secondary | ICD-10-CM | POA: Diagnosis not present

## 2019-12-01 ENCOUNTER — Encounter: Payer: Self-pay | Admitting: Family Medicine

## 2020-01-05 ENCOUNTER — Telehealth: Payer: Self-pay

## 2020-01-05 NOTE — Telephone Encounter (Signed)
LVM for pt to call office to schedule AWV. [facility] 01-05-20

## 2020-01-31 ENCOUNTER — Telehealth: Payer: Self-pay | Admitting: Internal Medicine

## 2020-01-31 NOTE — Telephone Encounter (Signed)
Left message for pt to call to schedule awv

## 2020-02-03 ENCOUNTER — Other Ambulatory Visit: Payer: PPO

## 2020-02-03 ENCOUNTER — Other Ambulatory Visit: Payer: Self-pay

## 2020-02-03 DIAGNOSIS — M810 Age-related osteoporosis without current pathological fracture: Secondary | ICD-10-CM

## 2020-02-03 MED ORDER — DENOSUMAB 60 MG/ML ~~LOC~~ SOSY
60.0000 mg | PREFILLED_SYRINGE | Freq: Once | SUBCUTANEOUS | Status: AC
  Administered 2020-02-03: 60 mg via SUBCUTANEOUS

## 2020-03-15 ENCOUNTER — Encounter: Payer: Self-pay | Admitting: Family Medicine

## 2020-03-15 ENCOUNTER — Ambulatory Visit (INDEPENDENT_AMBULATORY_CARE_PROVIDER_SITE_OTHER): Payer: PPO | Admitting: Family Medicine

## 2020-03-15 ENCOUNTER — Other Ambulatory Visit: Payer: Self-pay

## 2020-03-15 DIAGNOSIS — M810 Age-related osteoporosis without current pathological fracture: Secondary | ICD-10-CM

## 2020-03-15 DIAGNOSIS — R519 Headache, unspecified: Secondary | ICD-10-CM | POA: Diagnosis not present

## 2020-03-15 DIAGNOSIS — M542 Cervicalgia: Secondary | ICD-10-CM
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
## 2020-03-19 ENCOUNTER — Other Ambulatory Visit: Payer: Self-pay

## 2020-03-19 ENCOUNTER — Encounter: Payer: Self-pay | Admitting: Family Medicine

## 2020-03-19 ENCOUNTER — Telehealth: Payer: PPO | Admitting: Family Medicine

## 2020-03-19 ENCOUNTER — Telehealth (INDEPENDENT_AMBULATORY_CARE_PROVIDER_SITE_OTHER): Payer: PPO | Admitting: Family Medicine

## 2020-03-19 VITALS — Ht 64.0 in | Wt 104.0 lb

## 2020-03-19 DIAGNOSIS — R1031 Right lower quadrant pain: Secondary | ICD-10-CM

## 2020-03-19 DIAGNOSIS — R197 Diarrhea, unspecified: Secondary | ICD-10-CM

## 2020-03-19 NOTE — Progress Notes (Signed)
Start time: 1:32 End time: 2:07  Virtual Visit via Video Note  I connected with Deborah Bray on 03/19/20 at  1:30 PM EDT by a video enabled telemedicine application and verified that I am speaking with the correct person using two identifiers.  Location: Patient: home Provider: office   I discussed the limitations of evaluation and management by telemedicine and the availability of in person appointments. The patient expressed understanding and agreed to proceed.  History of Present Illness: Chief Complaint  Patient presents with  . Abdominal Pain    VIRTUAL abdominal pain and diarrhea that started Friday. She has tried advil but has not helped. Having diarrhea every 20 minutes, but has not had nmuch to eat since Friday execept some Cheerio's. Trying to take small sips of water. Had chills/sweats Fri/Sat-does not have a thermometer.   3 days ago, out of the blue, she started with some cramping in her RLQ, and by that evening her pain got worse, sometimes also having pain on the left and she was having diarrhea.   Having diarrhea about every 15 minutes, and getting up 4-5x/night.  Stools are watery, with some form to it (not completely watery).  Started out yellow-brownish green, now it is yellow.  Sees some "filaments and particles" in it.  No blood or mucus in the stool. Decrease in appetite, reports 6# weight loss since Friday.  Denies nausea or vomiting. Denies feeling dizzy or lightheaded--though did feel dizzy when she stood during visit (to show where stomach hurt).  Abdominal pain is constant, worse with movements. Not improved after a bowel movement.  +lots of gas, feels bloated. Has eaten some dry cheerios, nothing else.  Hasn't tried any OTC meds other than ibuprofen for her pain, which hasn't helped.  Denies travel, camping. Denies any antibiotics, no undercooked/spoiled foods. No sick contacts. Had [employer] pizza prior to onset of symptoms on Friday, seemed  fine.  She does have h/o diverticulitis a couple of years ago. CT 12/2015 showed extensive sigmoid diverticulitis. This feels very different--that was across whole abdomen, recalls straining.   PMH, PSH, SH reviewed 2 glasses of wine/d  Outpatient Encounter Medications as of 03/19/2020  Medication Sig  . sertraline (ZOLOFT) 100 MG tablet Take 100 mg by mouth daily.  Marland Kitchen albuterol (PROVENTIL HFA;VENTOLIN HFA) 108 (90 Base) MCG/ACT inhaler Inhale 2 puffs into the lungs every 6 (six) hours as needed for wheezing or shortness of breath. (Patient not taking: Reported on 03/19/2020)  . budesonide-formoterol (SYMBICORT) 80-4.5 MCG/ACT inhaler Inhale 2 puffs into the lungs 2 (two) times daily as needed (asthma).    No facility-administered encounter medications on file as of 03/19/2020.   Allergies  Allergen Reactions  . Bee Venom Anaphylaxis and Swelling  . Shellfish Allergy Anaphylaxis  . Codeine Nausea And Vomiting  . Sulfonamide Derivatives Nausea And Vomiting  . Tetracycline     Unsure about this med 08/28/15    ROS: No known fever, chills, URI symptoms, cough, shortness of breath, chest pain. No urinary complaints, color is noted to be fairly dark. +decreased appetite, no nausea/vomiting. +diarrhea per HPI. No bleeding, bruising rash or other complaints. See HPI.   Observations/Objective: Ht 5\' 4"  (1.626 m)   Wt 104 lb (47.2 kg)   BMI 17.85 kg/m   Pleasant, well-appearing female in no distress HEENT: conjunctiva and sclera are clear, EOMI She is alert, oriented. She is speaking easily, no coughing. She stood up and palpaged her abdomen--she had slight tenderness in suprapubic area, but  was very tender at RLQ, extending to ASIS.  There was no rebound (hurt more to press than to let go). Nontender on LLQ, epigastrium.   Assessment and Plan:  Diarrhea, unspecified type - suspect infectious/viral (no known sick contacts or other known causes); supportive measures.  Will need add'l  w/u if not improving.  Right lower quadrant abdominal pain - and some more diffuse pain as well.  No rebound currently.  May need imaging and further eval if not improving   Tylenol; advil with food only  Simethicone Imodium Probiotics. Hydration  F/u 2 d if ongoing pain, diarrhea, any fever, blood in stool, or vomiting (getting worse) Stool studies, blood tests, abdominal imaging.  Disc s/sx dehydration, and to see care in ER if they develop.  Signed up for  MyChart, explained where to find AVS.  Scheduled for 5/17 wellness visit with Vickie. Understands to return sooner if not improving or worse.   Follow Up Instructions:    I discussed the assessment and treatment plan with the patient. The patient was provided an opportunity to ask questions and all were answered. The patient agreed with the plan and demonstrated an understanding of the instructions.   The patient was advised to call back or seek an in-person evaluation if the symptoms worsen or if the condition fails to improve as anticipated.  I provided 35 minutes of non-face-to-face time during this encounter. Additional time spent in chart review and documentation.   Vikki Ports, MD

## 2020-03-19 NOTE — Patient Instructions (Signed)
Use tylenol if needed for pain. Use advil only if you are able to take it with food (not on an empty stomach). I recommend trying Simethicone (such as in Gas-X) to see if this helps with the bloating and discomfort. Use Imodium as needed for diarrhea (follow instructions on the bottle). I recommend using Probiotics until your bowel are back to normal.  Be sure to stay well hydrated. Drink plenty of liquids (water, gatorade--any non-caffeinated, non-alcoholic beverage will work to hydrate you). Aim is to keep your urine very light in color.  If you are feeling lightheaded, weak, urine is very dark or you aren't making much urine, if your mouth is very dry--these are signs of dehydration.  If you are getting dehydrated, you may need to go to the emergency room for IV fluids.  We discussed today avoiding dairy (for at least 5-7 days after diarrhea resolves), as well as BRAT diet (bananas, rice, applesauce, toast), and advancing to a bland diet as tolerated.  If you aren't improving in the next couple of days, if you develop fever, worsening pain, vomiting, blood in stool, etc, you will need re-evaluation, including blood tests, possible stool studies, possible scans.   Diarrhea, Adult Diarrhea is frequent loose and watery bowel movements. Diarrhea can make you feel weak and cause you to become dehydrated. Dehydration can make you tired and thirsty, cause you to have a dry mouth, and decrease how often you urinate. Diarrhea typically lasts 2-3 days. However, it can last longer if it is a sign of something more serious. It is important to treat your diarrhea as told by your health care provider. Follow these instructions at home: Eating and drinking     Follow these recommendations as told by your health care provider:  Take an oral rehydration solution (ORS). This is an over-the-counter medicine that helps return your body to its normal balance of nutrients and water. It is found at pharmacies  and retail stores.  Drink plenty of fluids, such as water, ice chips, diluted fruit juice, and low-calorie sports drinks. You can drink milk also, if desired.  Avoid drinking fluids that contain a lot of sugar or caffeine, such as energy drinks, sports drinks, and soda.  Eat bland, easy-to-digest foods in small amounts as you are able. These foods include bananas, applesauce, rice, lean meats, toast, and crackers.  Avoid alcohol.  Avoid spicy or fatty foods.  Medicines  Take over-the-counter and prescription medicines only as told by your health care provider.  If you were prescribed an antibiotic medicine, take it as told by your health care provider. Do not stop using the antibiotic even if you start to feel better. General instructions   Wash your hands often using soap and water. If soap and water are not available, use a hand sanitizer. Others in the household should wash their hands as well. Hands should be washed: ? After using the toilet or changing a diaper. ? Before preparing, cooking, or serving food. ? While caring for a sick person or while visiting someone in a hospital.  Drink enough fluid to keep your urine pale yellow.  Rest at home while you recover.  Watch your condition for any changes.  Take a warm bath to relieve any burning or pain from frequent diarrhea episodes.  Keep all follow-up visits as told by your health care provider. This is important. Contact a health care provider if:  You have a fever.  Your diarrhea gets worse.  You have new  symptoms.  You cannot keep fluids down.  You feel light-headed or dizzy.  You have a headache.  You have muscle cramps. Get help right away if:  You have chest pain.  You feel extremely weak or you faint.  You have bloody or black stools or stools that look like tar.  You have severe pain, cramping, or bloating in your abdomen.  You have trouble breathing or you are breathing very quickly.  Your  heart is beating very quickly.  Your skin feels cold and clammy.  You feel confused.  You have signs of dehydration, such as: ? Dark urine, very little urine, or no urine. ? Cracked lips. ? Dry mouth. ? Sunken eyes. ? Sleepiness. ? Weakness. Summary  Diarrhea is frequent loose and watery bowel movements. Diarrhea can make you feel weak and cause you to become dehydrated.  Drink enough fluids to keep your urine pale yellow.  Make sure that you wash your hands after using the toilet. If soap and water are not available, use hand sanitizer.  Contact a health care provider if your diarrhea gets worse or you have new symptoms.  Get help right away if you have signs of dehydration. This information is not intended to replace advice given to you by your health care provider. Make sure you discuss any questions you have with your health care provider. Document Revised: 03/22/2019 Document Reviewed: 04/09/2018 Elsevier Patient Education  2020 [place] Choices to Help Relieve Diarrhea, Adult When you have diarrhea, the foods you eat and your eating habits are very important. Choosing the right foods and drinks can help:  Relieve diarrhea.  Replace lost fluids and nutrients.  Prevent dehydration. What general guidelines should I follow?  Relieving diarrhea  Choose foods with less than 2 g or .07 oz. of fiber per serving.  Limit fats to less than 8 tsp (38 g or 1.34 oz.) a day.  Avoid the following: ? Foods and beverages sweetened with high-fructose corn syrup, honey, or sugar alcohols such as xylitol, sorbitol, and mannitol. ? Foods that contain a lot of fat or sugar. ? Fried, greasy, or spicy foods. ? High-fiber grains, breads, and cereals. ? Raw fruits and vegetables.  Eat foods that are rich in probiotics. These foods include dairy products such as yogurt and fermented milk products. They help increase healthy bacteria in the stomach and intestines  (gastrointestinal tract, or GI tract).  If you have lactose intolerance, avoid dairy products. These may make your diarrhea worse.  Take medicine to help stop diarrhea (antidiarrheal medicine) only as told by your health care provider. Replacing nutrients  Eat small meals or snacks every 3-4 hours.  Eat bland foods, such as white rice, toast, or baked potato, until your diarrhea starts to get better. Gradually reintroduce nutrient-rich foods as tolerated or as told by your health care provider. This includes: ? Well-cooked protein foods. ? Peeled, seeded, and soft-cooked fruits and vegetables. ? Low-fat dairy products.  Take vitamin and mineral supplements as told by your health care provider. Preventing dehydration  Start by sipping water or a special solution to prevent dehydration (oral rehydration solution, ORS). Urine that is clear or pale yellow means that you are getting enough fluid.  Try to drink at least 8-10 cups of fluid each day to help replace lost fluids.  You may add other liquids in addition to water, such as clear juice or decaffeinated sports drinks, as tolerated or as told by your health care  provider.  Avoid drinks with caffeine, such as coffee, tea, or soft drinks.  Avoid alcohol. What foods are recommended?     The items listed may not be a complete list. Talk with your health care provider about what dietary choices are best for you. Grains White rice. White, [place], or pita breads (fresh or toasted), including plain rolls, buns, or bagels. White pasta. Saltine, soda, or graham crackers. Pretzels. Low-fiber cereal. Cooked cereals made with water (such as cornmeal, farina, or cream cereals). Plain muffins. Matzo. Melba toast. Zwieback. Vegetables Potatoes (without the skin). Most well-cooked and canned vegetables without skins or seeds. Tender lettuce. Fruits Apple sauce. Fruits canned in juice. Cooked apricots, cherries, grapefruit, peaches, pears, or  plums. Fresh bananas and cantaloupe. Meats and other protein foods Baked or boiled chicken. Eggs. Tofu. Fish. Seafood. Smooth nut butters. Ground or well-cooked tender beef, ham, veal, lamb, pork, or poultry. Dairy Plain yogurt, kefir, and unsweetened liquid yogurt. Lactose-free milk, buttermilk, skim milk, or soy milk. Low-fat or nonfat hard cheese. Beverages Water. Low-calorie sports drinks. Fruit juices without pulp. Strained tomato and vegetable juices. Decaffeinated teas. Sugar-free beverages not sweetened with sugar alcohols. Oral rehydration solutions, if approved by your health care provider. Seasoning and other foods Bouillon, broth, or soups made from recommended foods. What foods are not recommended? The items listed may not be a complete list. Talk with your health care provider about what dietary choices are best for you. Grains Whole grain, whole wheat, bran, or rye breads, rolls, pastas, and crackers. Wild or brown rice. Whole grain or bran cereals. Barley. Oats and oatmeal. Corn tortillas or taco shells. Granola. Popcorn. Vegetables Raw vegetables. Fried vegetables. Cabbage, broccoli, Brussels sprouts, artichokes, baked beans, beet greens, corn, kale, legumes, peas, sweet potatoes, and yams. Potato skins. Cooked spinach and cabbage. Fruits Dried fruit, including raisins and dates. Raw fruits. Stewed or dried prunes. Canned fruits with syrup. Meat and other protein foods Fried or fatty meats. Deli meats. Chunky nut butters. Nuts and seeds. Beans and lentils. Berniece Salines. Hot dogs. Sausage. Dairy High-fat cheeses. Whole milk, chocolate milk, and beverages made with milk, such as milk shakes. Half-and-half. Cream. sour cream. Ice cream. Beverages Caffeinated beverages (such as coffee, tea, soda, or energy drinks). Alcoholic beverages. Fruit juices with pulp. Prune juice. Soft drinks sweetened with high-fructose corn syrup or sugar alcohols. High-calorie sports drinks. Fats and  oils Butter. Cream sauces. Margarine. Salad oils. Plain salad dressings. Olives. Avocados. Mayonnaise. Sweets and desserts Sweet rolls, doughnuts, and sweet breads. Sugar-free desserts sweetened with sugar alcohols such as xylitol and sorbitol. Seasoning and other foods Honey. Hot sauce. Chili powder. Gravy. Cream-based or milk-based soups. Pancakes and waffles. Summary  When you have diarrhea, the foods you eat and your eating habits are very important.  Make sure you get at least 8-10 cups of fluid each day, or enough to keep your urine clear or pale yellow.  Eat bland foods and gradually reintroduce healthy, nutrient-rich foods as tolerated, or as told by your health care provider.  Avoid high-fiber, fried, greasy, or spicy foods. This information is not intended to replace advice given to you by your health care provider. Make sure you discuss any questions you have with your health care provider. Document Revised: 02/24/2019 Document Reviewed: 10/31/2016 Elsevier Patient Education  [place].

## 2020-03-21 ENCOUNTER — Ambulatory Visit (INDEPENDENT_AMBULATORY_CARE_PROVIDER_SITE_OTHER): Payer: PPO | Admitting: Family Medicine

## 2020-03-21 ENCOUNTER — Encounter: Payer: Self-pay | Admitting: Family Medicine

## 2020-03-21 ENCOUNTER — Telehealth: Payer: PPO | Admitting: Family Medicine

## 2020-03-21 ENCOUNTER — Other Ambulatory Visit: Payer: Self-pay

## 2020-03-21 VITALS — BP 100/64 | HR 60 | Temp 97.3°F | Ht 64.0 in | Wt 105.0 lb

## 2020-03-21 DIAGNOSIS — R1031 Right lower quadrant pain: Secondary | ICD-10-CM

## 2020-03-21 DIAGNOSIS — R197 Diarrhea, unspecified: Secondary | ICD-10-CM | POA: Diagnosis not present

## 2020-03-21 DIAGNOSIS — J45909 Unspecified asthma, uncomplicated: Secondary | ICD-10-CM | POA: Diagnosis not present

## 2020-03-21 LAB — CBC WITH DIFFERENTIAL/PLATELET
Basophils Absolute: 0 10*3/uL (ref 0.0–0.2)
Basos: 0 %
EOS (ABSOLUTE): 0 10*3/uL (ref 0.0–0.4)
Eos: 0 %
Hematocrit: 41.4 % (ref 34.0–46.6)
Hemoglobin: 14.2 g/dL (ref 11.1–15.9)
Lymphocytes Absolute: 1.2 10*3/uL (ref 0.7–3.1)
Lymphs: 20 %
MCH: 30.7 pg (ref 26.6–33.0)
MCHC: 34.3 g/dL (ref 31.5–35.7)
MCV: 90 fL (ref 79–97)
Monocytes Absolute: 1.1 10*3/uL — ABNORMAL HIGH (ref 0.1–0.9)
Monocytes: 17 %
Neutrophils Absolute: 3.9 10*3/uL (ref 1.4–7.0)
Neutrophils: 63 %
Platelets: 270 10*3/uL (ref 150–450)
RBC: 4.62 x10E6/uL (ref 3.77–5.28)
RDW: 13.5 % (ref 11.7–15.4)
WBC: 6.2 10*3/uL (ref 3.4–10.8)

## 2020-03-21 LAB — COMPREHENSIVE METABOLIC PANEL
ALT: 10 IU/L (ref 0–32)
AST: 19 IU/L (ref 0–40)
Albumin/Globulin Ratio: 1.6 (ref 1.2–2.2)
Albumin: 4.4 g/dL (ref 3.8–4.8)
Alkaline Phosphatase: 55 IU/L (ref 39–117)
BUN/Creatinine Ratio: 21 (ref 12–28)
BUN: 25 mg/dL (ref 8–27)
Bilirubin Total: 0.3 mg/dL (ref 0.0–1.2)
CO2: 25 mmol/L (ref 20–29)
Calcium: 9.3 mg/dL (ref 8.7–10.3)
Chloride: 102 mmol/L (ref 96–106)
Creatinine, Ser: 1.19 mg/dL — ABNORMAL HIGH (ref 0.57–1.00)
GFR calc Af Amer: 53 mL/min/{1.73_m2} — ABNORMAL LOW (ref >59)
GFR calc non Af Amer: 46 mL/min/{1.73_m2} — ABNORMAL LOW (ref >59)
Globulin, Total: 2.8 g/dL (ref 1.5–4.5)
Glucose: 112 mg/dL — ABNORMAL HIGH (ref 65–99)
Potassium: 4.5 mmol/L (ref 3.5–5.2)
Sodium: 135 mmol/L (ref 134–144)
Total Protein: 7.2 g/dL (ref 6.0–8.5)
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
## 2020-03-21 NOTE — Patient Instructions (Addendum)
Continue to avoid dairy, eat something bland (BRAT diet), stay well hydrated (light urine). We will be setting up a CT scan and contact you tomorrow. We will be in touch with your lab results. Return your stool studies.  Continue to drink plenty of water. You may use antihistamines such as claritin, allegra or zyrtec, +/- nasal steroids such as flonase, if needed to treat your allergies. Mucinex can also be helpful for sinus pressure and thick postnasal drainage that contributes to cough.

## 2020-03-22 ENCOUNTER — Other Ambulatory Visit: Payer: Self-pay

## 2020-03-22 ENCOUNTER — Other Ambulatory Visit: Payer: Self-pay | Admitting: *Deleted

## 2020-03-22 ENCOUNTER — Ambulatory Visit (HOSPITAL_COMMUNITY)
Admission: RE | Admit: 2020-03-22 | Discharge: 2020-03-22 | Disposition: A | Discharge status: Home / Self Care | Payer: PPO | Source: Ambulatory Visit | Attending: Family Medicine | Admitting: Family Medicine

## 2020-03-22 DIAGNOSIS — R1031 Right lower quadrant pain: Secondary | ICD-10-CM | POA: Diagnosis not present

## 2020-03-22 DIAGNOSIS — R197 Diarrhea, unspecified: Secondary | ICD-10-CM | POA: Diagnosis not present

## 2020-03-22 DIAGNOSIS — K5730 Diverticulosis of large intestine without perforation or abscess without bleeding: Secondary | ICD-10-CM | POA: Diagnosis not present

## 2020-03-22 MED ORDER — SODIUM CHLORIDE (PF) 0.9 % IJ SOLN
INTRAMUSCULAR | Status: AC
  Filled 2020-03-22: qty 50

## 2020-03-22 MED ORDER — IOHEXOL 300 MG/ML  SOLN
100.0000 mL | Freq: Once | INTRAMUSCULAR | Status: AC | PRN
  Administered 2020-03-22: 100 mL via INTRAVENOUS

## 2020-03-23 ENCOUNTER — Other Ambulatory Visit: Payer: Self-pay | Admitting: Internal Medicine

## 2020-03-23 MED ORDER — CIPROFLOXACIN HCL 500 MG PO TABS
500.0000 mg | ORAL_TABLET | Freq: Two times a day (BID) | ORAL | 0 refills | Status: DC
Start: 2020-03-23 — End: 2020-04-02

## 2020-03-26 ENCOUNTER — Telehealth: Payer: Self-pay | Admitting: Internal Medicine

## 2020-03-26 LAB — CDIFF NAA+O+P+STOOL CULTURE
E coli, Shiga toxin Assay: NEGATIVE
Toxigenic C. Difficile by PCR: NEGATIVE

## 2020-03-26 NOTE — Telephone Encounter (Signed)
Pt was notified of results

## 2020-03-26 NOTE — Telephone Encounter (Signed)
Usually treatment is only needed for 3-5 days.  If she is much better, I don't think we need to refill it.  Wouldn't want her to have complications due to the Cipro if a short course worked fairly well.  Final reports aren't back (for O&P), which is why she probably can't see the stool study results herself yet.    Remind her to avoid dairy still for another 5 days or so.  I'm so glad she is feeling better!

## 2020-03-26 NOTE — Telephone Encounter (Signed)
Pt states that she is feeling much better.  Gave her lab results. She says since taking cipro its like a miracle drug. No pain, getting her energy back. She is still having some diarrhea but not as often. She did finish her cipro yesterday and wanted to know if she need another round of antibioics. Please advise
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
## 2020-04-02 ENCOUNTER — Other Ambulatory Visit: Payer: Self-pay

## 2020-04-02 ENCOUNTER — Encounter: Payer: Self-pay | Admitting: Family Medicine

## 2020-04-02 ENCOUNTER — Ambulatory Visit (INDEPENDENT_AMBULATORY_CARE_PROVIDER_SITE_OTHER): Payer: PPO | Admitting: Family Medicine

## 2020-04-02 VITALS — BP 110/70 | HR 76 | Temp 96.9°F | Ht 63.5 in | Wt 109.6 lb

## 2020-04-02 DIAGNOSIS — Z5320 Procedure and treatment not carried out because of patient's decision for unspecified reasons: Secondary | ICD-10-CM

## 2020-04-02 DIAGNOSIS — J45909 Unspecified asthma, uncomplicated: Secondary | ICD-10-CM

## 2020-04-02 DIAGNOSIS — Z1211 Encounter for screening for malignant neoplasm of colon: Secondary | ICD-10-CM

## 2020-04-02 DIAGNOSIS — E559 Vitamin D deficiency, unspecified: Secondary | ICD-10-CM | POA: Diagnosis not present

## 2020-04-02 DIAGNOSIS — E7800 Pure hypercholesterolemia, unspecified: Secondary | ICD-10-CM

## 2020-04-02 DIAGNOSIS — F329 Major depressive disorder, single episode, unspecified: Secondary | ICD-10-CM

## 2020-04-02 DIAGNOSIS — Z0000 Encounter for general adult medical examination without abnormal findings: Secondary | ICD-10-CM | POA: Diagnosis not present

## 2020-04-02 DIAGNOSIS — F32A Depression, unspecified: Secondary | ICD-10-CM

## 2020-04-02 DIAGNOSIS — M810 Age-related osteoporosis without current pathological fracture: Secondary | ICD-10-CM | POA: Diagnosis not present

## 2020-04-02 DIAGNOSIS — Z87410 Personal history of cervical dysplasia: Secondary | ICD-10-CM

## 2020-04-02 DIAGNOSIS — Z7189 Other specified counseling: Secondary | ICD-10-CM

## 2020-04-02 DIAGNOSIS — Z136 Encounter for screening for cardiovascular disorders: Secondary | ICD-10-CM | POA: Diagnosis not present

## 2020-04-02 DIAGNOSIS — Z124 Encounter for screening for malignant neoplasm of cervix: Secondary | ICD-10-CM | POA: Diagnosis not present

## 2020-04-02 NOTE — Patient Instructions (Addendum)
  Ms. Hoelzel , Thank you for taking time to come for your Medicare Wellness Visit. I appreciate your ongoing commitment to your health goals. Please review the following plan we discussed and let me know if I can assist you in the future.   These are the goals we discussed:  Get the Tdap at your pharmacy. Last Tetanus on record was 2009.  Go to [facility] imaging for your X ray.   Schedule with your dentist.   You will receive calls from [employer] and [facility]   I will be in touch with your lab results.     This is a list of the screening recommended for you and due dates:  Health Maintenance  Topic Date Due  . Colon Cancer Screening  Never done  . Tetanus Vaccine  11/17/2010  . Flu Shot  06/17/2020  . DEXA scan (bone density measurement)  Completed  . COVID-19 Vaccine  Completed  .  Hepatitis C: One time screening is recommended by Center for Disease Control  (CDC) for  adults born from [birth year] through 1967.   Completed  . Pneumonia vaccines  Completed  . Mammogram  Discontinued

## 2020-04-03 LAB — CBC WITH DIFFERENTIAL/PLATELET
Basophils Absolute: 0 10*3/uL (ref 0.0–0.2)
Basos: 1 %
EOS (ABSOLUTE): 0.3 10*3/uL (ref 0.0–0.4)
Eos: 4 %
Hematocrit: 39.1 % (ref 34.0–46.6)
Hemoglobin: 12.9 g/dL (ref 11.1–15.9)
Immature Grans (Abs): 0 10*3/uL (ref 0.0–0.1)
Immature Granulocytes: 0 %
Lymphocytes Absolute: 2.2 10*3/uL (ref 0.7–3.1)
Lymphs: 33 %
MCH: 31.2 pg (ref 26.6–33.0)
MCHC: 33 g/dL (ref 31.5–35.7)
MCV: 95 fL (ref 79–97)
Monocytes Absolute: 0.6 10*3/uL (ref 0.1–0.9)
Monocytes: 9 %
Neutrophils Absolute: 3.6 10*3/uL (ref 1.4–7.0)
Neutrophils: 53 %
Platelets: 330 10*3/uL (ref 150–450)
RBC: 4.13 x10E6/uL (ref 3.77–5.28)
RDW: 12.7 % (ref 11.7–15.4)
WBC: 6.7 10*3/uL (ref 3.4–10.8)

## 2020-04-03 LAB — COMPREHENSIVE METABOLIC PANEL
ALT: 8 IU/L (ref 0–32)
AST: 17 IU/L (ref 0–40)
Albumin/Globulin Ratio: 1.5 (ref 1.2–2.2)
Albumin: 3.9 g/dL (ref 3.8–4.8)
Alkaline Phosphatase: 40 IU/L — ABNORMAL LOW (ref 48–121)
BUN/Creatinine Ratio: 22 (ref 12–28)
BUN: 20 mg/dL (ref 8–27)
Bilirubin Total: 0.3 mg/dL (ref 0.0–1.2)
CO2: 25 mmol/L (ref 20–29)
Calcium: 10.2 mg/dL (ref 8.7–10.3)
Chloride: 104 mmol/L (ref 96–106)
Creatinine, Ser: 0.93 mg/dL (ref 0.57–1.00)
GFR calc Af Amer: 72 mL/min/{1.73_m2} (ref >59)
GFR calc non Af Amer: 62 mL/min/{1.73_m2} (ref >59)
Globulin, Total: 2.6 g/dL (ref 1.5–4.5)
Glucose: 97 mg/dL (ref 65–99)
Potassium: 5.7 mmol/L — ABNORMAL HIGH (ref 3.5–5.2)
Sodium: 142 mmol/L (ref 134–144)
Total Protein: 6.5 g/dL (ref 6.0–8.5)

## 2020-04-03 LAB — LIPID PANEL
Chol/HDL Ratio: 2.8 ratio (ref 0.0–4.4)
Cholesterol, Total: 210 mg/dL — ABNORMAL HIGH (ref 100–199)
HDL: 76 mg/dL (ref >39)
LDL Chol Calc (NIH): 113 mg/dL — ABNORMAL HIGH (ref 0–99)
Triglycerides: 120 mg/dL (ref 0–149)
VLDL Cholesterol Cal: 21 mg/dL (ref 5–40)

## 2020-04-03 LAB — VITAMIN D 25 HYDROXY (VIT D DEFICIENCY, FRACTURES): Vit D, 25-Hydroxy: 30.4 ng/mL (ref 30.0–100.0)

## 2020-04-03 LAB — TSH: TSH: 5.44 u[IU]/mL — ABNORMAL HIGH (ref 0.450–4.500)

## 2020-04-03 LAB — T4, FREE: Free T4: 0.81 ng/dL — ABNORMAL LOW (ref 0.82–1.77)

## 2020-04-03 NOTE — Progress Notes (Signed)
Please schedule her to come back for a follow up to see me either Thursday or Friday for abnormal labs and we will need to recheck labs at her visit. She has high potassium and abnormal thyroid function. She needs to come in well hydrated.

## 2020-04-04 ENCOUNTER — Ambulatory Visit
Admission: RE | Admit: 2020-04-04 | Discharge: 2020-04-04 | Disposition: A | Discharge status: Home / Self Care | Payer: PPO | Source: Ambulatory Visit | Attending: Family Medicine | Admitting: Family Medicine

## 2020-04-04 DIAGNOSIS — R519 Headache, unspecified: Secondary | ICD-10-CM

## 2020-04-04 DIAGNOSIS — M542 Cervicalgia: Secondary | ICD-10-CM | POA: Diagnosis not present

## 2020-04-04 DIAGNOSIS — S0993XA Unspecified injury of face, initial encounter: Secondary | ICD-10-CM | POA: Diagnosis not present

## 2020-04-06 ENCOUNTER — Encounter: Payer: Self-pay | Admitting: Family Medicine

## 2020-04-06 ENCOUNTER — Ambulatory Visit (INDEPENDENT_AMBULATORY_CARE_PROVIDER_SITE_OTHER): Payer: PPO | Admitting: Family Medicine

## 2020-04-06 ENCOUNTER — Other Ambulatory Visit: Payer: Self-pay

## 2020-04-06 VITALS — BP 120/70 | HR 73 | Wt 108.0 lb

## 2020-04-06 DIAGNOSIS — R7989 Other specified abnormal findings of blood chemistry: Secondary | ICD-10-CM | POA: Diagnosis not present

## 2020-04-06 DIAGNOSIS — E875 Hyperkalemia: Secondary | ICD-10-CM | POA: Diagnosis not present

## 2020-04-06 DIAGNOSIS — E7800 Pure hypercholesterolemia, unspecified: Secondary | ICD-10-CM

## 2020-04-06 NOTE — Progress Notes (Signed)
   Subjective:    Patient ID: Deborah Bray, female    DOB: 1950/12/06, 69 y.o.   MRN: [id number]  HPI Chief Complaint  Patient presents with  . discuss labs    discuss labs   She is here to discuss abnormal lab values.  Potassium 5.7 She was eating 2-3 bananas per day but since her labs were checked she stopped.  Does not use "no salt". Denies taking a potassium supplement.   TSH 5.4 and free T4 0.81  LDL 113 with an HDL of 76  She denies any symptoms at all and reports feeling at her baseline.     Review of Systems Pertinent positives and negatives in the history of present illness.     Objective:   Physical Exam BP 120/70   Pulse 73   Wt 108 lb (49 kg)   SpO2 98%   BMI 18.83 kg/m   Alert and oriented in no acute distress.  Not otherwise examined.      Assessment & Plan:  Serum potassium elevated - Plan: Basic metabolic panel  Elevated TSH - Plan: TSH, T4, free, T3, Thyroid peroxidase antibody  Reviewed labs in person. She is asymptomatic.  We will recheck potassium and thyroid panel.  Follow-up pending results

## 2020-04-07 LAB — BASIC METABOLIC PANEL
BUN/Creatinine Ratio: 16 (ref 12–28)
BUN: 14 mg/dL (ref 8–27)
CO2: 23 mmol/L (ref 20–29)
Calcium: 9.6 mg/dL (ref 8.7–10.3)
Chloride: 102 mmol/L (ref 96–106)
Creatinine, Ser: 0.88 mg/dL (ref 0.57–1.00)
GFR calc Af Amer: 77 mL/min/{1.73_m2} (ref >59)
GFR calc non Af Amer: 67 mL/min/{1.73_m2} (ref >59)
Glucose: 88 mg/dL (ref 65–99)
Potassium: 5.3 mmol/L — ABNORMAL HIGH (ref 3.5–5.2)
Sodium: 139 mmol/L (ref 134–144)

## 2020-04-07 LAB — TSH: TSH: 3.88 u[IU]/mL (ref 0.450–4.500)

## 2020-04-07 LAB — THYROID PEROXIDASE ANTIBODY: Thyroperoxidase Ab SerPl-aCnc: <9 IU/mL (ref 0–34)

## 2020-04-07 LAB — T4, FREE: Free T4: 0.87 ng/dL (ref 0.82–1.77)

## 2020-04-07 LAB — T3: T3, Total: 96 ng/dL (ref 71–180)

## 2020-04-08 ENCOUNTER — Encounter: Payer: Self-pay | Admitting: Family Medicine

## 2020-04-09 ENCOUNTER — Encounter: Payer: Self-pay | Admitting: Obstetrics and Gynecology

## 2020-04-09 ENCOUNTER — Other Ambulatory Visit: Payer: Self-pay

## 2020-04-09 ENCOUNTER — Ambulatory Visit (INDEPENDENT_AMBULATORY_CARE_PROVIDER_SITE_OTHER): Payer: PPO | Admitting: Obstetrics and Gynecology

## 2020-04-09 VITALS — BP 114/70 | Ht 63.0 in | Wt 109.0 lb

## 2020-04-09 DIAGNOSIS — N898 Other specified noninflammatory disorders of vagina: Secondary | ICD-10-CM | POA: Diagnosis not present

## 2020-04-09 DIAGNOSIS — Z87410 Personal history of cervical dysplasia: Secondary | ICD-10-CM

## 2020-04-09 DIAGNOSIS — Z01419 Encounter for gynecological examination (general) (routine) without abnormal findings: Secondary | ICD-10-CM | POA: Diagnosis not present

## 2020-04-09 DIAGNOSIS — Z9289 Personal history of other medical treatment: Secondary | ICD-10-CM

## 2020-04-09 DIAGNOSIS — Z124 Encounter for screening for malignant neoplasm of cervix: Secondary | ICD-10-CM

## 2020-04-09 DIAGNOSIS — M810 Age-related osteoporosis without current pathological fracture: Secondary | ICD-10-CM | POA: Diagnosis not present

## 2020-04-09 NOTE — Progress Notes (Signed)
Deborah Bray 1951-02-08 [id number]  SUBJECTIVE:  69 y.o. G0P0 female presents for a routine gynecologic exam and Pap smear.  She was last seen in this office in 2015.  She has self treated for a vaginal yeast infection, she is on day 4 of OTC Monistat therapy with great improvement of the itching and discharge symptoms.  She otherwise has no gynecologic concerns.  Current Outpatient Medications  Medication Sig Dispense Refill  . albuterol (PROVENTIL HFA;VENTOLIN HFA) 108 (90 Base) MCG/ACT inhaler Inhale 2 puffs into the lungs every 6 (six) hours as needed for wheezing or shortness of breath. 1 Inhaler 1  . budesonide-formoterol (SYMBICORT) 80-4.5 MCG/ACT inhaler Inhale 2 puffs into the lungs 2 (two) times daily as needed (asthma).     . sertraline (ZOLOFT) 100 MG tablet Take 100 mg by mouth daily.  1   No current facility-administered medications for this visit.   Allergies: Bee venom, Shellfish allergy, Codeine, Sulfonamide derivatives, and Tetracycline  No LMP recorded. Patient is postmenopausal.  Past medical history,surgical history, problem list, medications, allergies, family history and social history were all reviewed and documented as reviewed in the EPIC chart.  ROS:  Feeling well. No dyspnea or chest pain on exertion.  No abdominal pain, change in bowel habits, black or bloody stools.  No urinary tract symptoms. GYN ROS: no abnormal bleeding, pelvic pain or discharge, no breast pain or new or enlarging lumps on self exam. No neurological complaints.   OBJECTIVE:  BP 114/70   Ht 5\' 3"  (1.6 m)   Wt 109 lb (49.4 kg)   BMI 19.31 kg/m  The patient appears well, alert, oriented x 3, in no distress. ENT normal.  Neck supple. No cervical or supraclavicular adenopathy or thyromegaly.  Lungs are clear, good air entry, no wheezes, rhonchi or rales. S1 and S2 normal, no murmurs, regular rate and rhythm.  Abdomen soft without tenderness, guarding, mass or organomegaly.    Neurological is normal, no focal findings.  BREAST EXAM: breasts appear normal, no suspicious masses, no skin or nipple changes or axillary nodes  PELVIC EXAM: VULVA: normal appearing vulva with no masses, tenderness or lesions, atrophic changes, VAGINA: normal appearing vagina with normal color, thick white discharge present, no lesions, CERVIX: normal atrophic appearing cervix without discharge or lesions, UTERUS: uterus is normal size, shape, consistency and nontender, ADNEXA: normal adnexa in size, nontender and no masses, PAP: Pap smear done today, thin-prep method  Chaperone: Caryn Bee present during the examination  ASSESSMENT:  69 y.o. G0P0 here for annual gynecologic exam  PLAN:   1. Postmenopausal.  No significant menopausal symptoms, no hot flashes, no vaginal bleeding. 2. Pap smear 2015.  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  Pap smears after that were apparently normal.  Due to the history and the long interval since last Pap smear, we did collect a Pap smear today. 3.  Vaginal discharge.  Sounds to be consistent with vaginal candidiasis and her use of Monistat is improving the symptoms, so she will continue use this for at least 7 days and if there is not resolution of symptoms she will let us know. 4. Mammogram not recently done.  Routine mammograms are highly recommended as breast cancer is the most commonly diagnosed cancer in women and the second most common cause of cancer related death in women.  Normal breast exam today.   5. Colonoscopy never, previously refused by patient.  Colon cancer is  a commonly diagnosed cancer and best caught early stage with the assistance of screening techniques including colonoscopy.  Cologuard is a less invasive option she could potentially discuss with her primary care provider. 6. History of osteoporosis.   DEXA 07/2019.  T-score -2.6.  Started on Prolia by her primary care provider where she will  continue to follow-up in regards to bone health management. 7.  Health maintenance.  No labs today as she normally has these completed with her primary care provider.   Return annually or sooner, prn.  Joseph Pierini MD 04/09/20

## 2020-04-09 NOTE — Addendum Note (Signed)
Addended by: Nelva Nay on: 04/09/2020 04:02 PM   Modules accepted: Orders

## 2020-04-10 LAB — PAP IG W/ RFLX HPV ASCU

## 2020-04-17 LAB — STATE LABORATORY REPORT

## 2020-05-10 ENCOUNTER — Telehealth (INDEPENDENT_AMBULATORY_CARE_PROVIDER_SITE_OTHER): Payer: PPO | Admitting: Family Medicine

## 2020-05-10 ENCOUNTER — Other Ambulatory Visit: Payer: Self-pay

## 2020-05-10 ENCOUNTER — Encounter: Payer: Self-pay | Admitting: Family Medicine

## 2020-05-10 VITALS — Wt 106.0 lb

## 2020-05-10 DIAGNOSIS — M25551 Pain in right hip: Secondary | ICD-10-CM

## 2020-05-10 DIAGNOSIS — G8929 Other chronic pain: Secondary | ICD-10-CM | POA: Diagnosis not present

## 2020-05-10 DIAGNOSIS — M810 Age-related osteoporosis without current pathological fracture: Secondary | ICD-10-CM | POA: Diagnosis not present

## 2020-05-10 NOTE — Progress Notes (Signed)
   Subjective:  Documentation for virtual audio and video telecommunications through [place] encounter:  The patient was located at home. 2 patient identifiers used.  The provider was located in the office. The patient did consent to this visit and is aware of possible charges through their insurance for this visit.  The other persons participating in this telemedicine service were none. Time spent on call was 11 minutes and in review of previous records 15 minutes total.  This virtual service is not related to other E/M service within previous 7 days.   Patient ID: Deborah Bray, female    DOB: 21-Aug-1951, 69 y.o.   MRN: [id number]  HPI Chief Complaint  Patient presents with  . hip pain    hip pain. going on for a while   Complains of chronic right hip pain. This is her chronic pain. No new injury. States she has been in severe pain for the past 2 weeks. She saw her massage therapist.   States she thinks she has piriformis syndrome.   She denies numbness, tingling or weakness of LE.   States she has seen Percell Creelman and [place] in the past for this and has had injections.   Denies fever, chills, chest pain, palpitations, abdominal pain, N/V/D, urinary symptoms, LE edema.   She has osteoporosis and gets Prolia every 6 months.     Review of Systems Pertinent positives and negatives in the history of present illness.     Objective:   Physical Exam Wt 106 lb (48.1 kg)   BMI 18.78 kg/m   Alert and oriented and in no acute distress. Normal speech, mood and thought process.       Assessment & Plan:  Chronic pain of right hip  Age-related osteoporosis without current pathological fracture  No red flag symptoms. So far massage therapy and stretching has helped.  Recommend conservative treatment with topical pain medication, ice or heat and Tylenol arthritis. She does not like to take medication. Hx of injections for similar pain.  Planning a hiking trip in the [place] in October and would like to be in good health. She will follow up with her orthopedist at [facility] and [place].

## 2020-05-24 ENCOUNTER — Encounter: Payer: Self-pay | Admitting: Family Medicine

## 2020-06-04 DIAGNOSIS — M24851 Other specific joint derangements of right hip, not elsewhere classified: Secondary | ICD-10-CM | POA: Diagnosis not present

## 2020-06-04 DIAGNOSIS — M25751 Osteophyte, right hip: Secondary | ICD-10-CM | POA: Diagnosis not present

## 2020-06-04 DIAGNOSIS — M1611 Unilateral primary osteoarthritis, right hip: Secondary | ICD-10-CM | POA: Diagnosis not present

## 2020-06-11 DIAGNOSIS — M25551 Pain in right hip: Secondary | ICD-10-CM | POA: Diagnosis not present

## 2020-07-02 DIAGNOSIS — M1611 Unilateral primary osteoarthritis, right hip: Secondary | ICD-10-CM | POA: Diagnosis not present

## 2020-07-13 DIAGNOSIS — J3081 Allergic rhinitis due to animal (cat) (dog) hair and dander: Secondary | ICD-10-CM | POA: Diagnosis not present

## 2020-07-13 DIAGNOSIS — J301 Allergic rhinitis due to pollen: Secondary | ICD-10-CM | POA: Diagnosis not present

## 2020-07-13 DIAGNOSIS — J4540 Moderate persistent asthma, uncomplicated: Secondary | ICD-10-CM | POA: Diagnosis not present

## 2020-07-13 DIAGNOSIS — J3089 Other allergic rhinitis: Secondary | ICD-10-CM | POA: Diagnosis not present

## 2020-08-14 ENCOUNTER — Other Ambulatory Visit: Payer: Self-pay

## 2020-08-14 ENCOUNTER — Other Ambulatory Visit: Payer: PPO

## 2020-08-14 DIAGNOSIS — M810 Age-related osteoporosis without current pathological fracture: Secondary | ICD-10-CM

## 2020-08-14 MED ORDER — DENOSUMAB 60 MG/ML ~~LOC~~ SOSY
60.0000 mg | PREFILLED_SYRINGE | Freq: Once | SUBCUTANEOUS | Status: AC
  Administered 2020-08-14: 60 mg via SUBCUTANEOUS

## 2020-09-26 DIAGNOSIS — L570 Actinic keratosis: Secondary | ICD-10-CM | POA: Diagnosis not present

## 2020-09-26 DIAGNOSIS — L814 Other melanin hyperpigmentation: Secondary | ICD-10-CM | POA: Diagnosis not present

## 2020-09-26 DIAGNOSIS — D485 Neoplasm of uncertain behavior of skin: Secondary | ICD-10-CM | POA: Diagnosis not present

## 2020-10-03 ENCOUNTER — Ambulatory Visit: Payer: PPO | Admitting: Family Medicine

## 2020-10-19 DIAGNOSIS — L570 Actinic keratosis: Secondary | ICD-10-CM | POA: Diagnosis not present

## 2020-10-29 DIAGNOSIS — M25551 Pain in right hip: Secondary | ICD-10-CM | POA: Diagnosis not present

## 2020-11-22 DIAGNOSIS — M5136 Other intervertebral disc degeneration, lumbar region: Secondary | ICD-10-CM | POA: Diagnosis not present

## 2020-11-22 DIAGNOSIS — M1611 Unilateral primary osteoarthritis, right hip: Secondary | ICD-10-CM | POA: Diagnosis not present

## 2020-11-22 DIAGNOSIS — M51369 Other intervertebral disc degeneration, lumbar region without mention of lumbar back pain or lower extremity pain: Secondary | ICD-10-CM | POA: Insufficient documentation

## 2020-12-17 ENCOUNTER — Telehealth (INDEPENDENT_AMBULATORY_CARE_PROVIDER_SITE_OTHER): Payer: PPO | Admitting: Family Medicine

## 2020-12-17 ENCOUNTER — Other Ambulatory Visit (INDEPENDENT_AMBULATORY_CARE_PROVIDER_SITE_OTHER): Payer: PPO

## 2020-12-17 ENCOUNTER — Encounter: Payer: Self-pay | Admitting: Family Medicine

## 2020-12-17 ENCOUNTER — Other Ambulatory Visit: Payer: Self-pay

## 2020-12-17 ENCOUNTER — Other Ambulatory Visit: Payer: Self-pay | Admitting: Internal Medicine

## 2020-12-17 VITALS — Wt 110.0 lb

## 2020-12-17 DIAGNOSIS — J029 Acute pharyngitis, unspecified: Secondary | ICD-10-CM

## 2020-12-17 DIAGNOSIS — Z20822 Contact with and (suspected) exposure to covid-19: Secondary | ICD-10-CM

## 2020-12-17 DIAGNOSIS — R52 Pain, unspecified: Secondary | ICD-10-CM

## 2020-12-17 DIAGNOSIS — J069 Acute upper respiratory infection, unspecified: Secondary | ICD-10-CM

## 2020-12-17 LAB — POC COVID19 BINAXNOW: SARS Coronavirus 2 Ag: POSITIVE — AB

## 2020-12-17 LAB — POCT INFLUENZA A/B
Influenza A, POC: NEGATIVE
Influenza B, POC: NEGATIVE

## 2020-12-17 NOTE — Progress Notes (Signed)
   Subjective:  Documentation for virtual telephone encounter.  The patient was located at home. The provider was located in the office. The patient did consent to this visit and is aware of possible charges through their insurance for this visit.  The other persons participating in this telemedicine service were none. Time spent on call was 13 minutes and in review of previous records 16 minutes total.  This virtual service is not related to other E/M service within previous 7 days.  99441 (5-45min) 99442 (11-50min) 99443 (21-15min)    Patient ID: Deborah Bray, female    DOB: 09/30/1951, 70 y.o.   MRN: [id number]  HPI Chief Complaint  Patient presents with  . sick    Sick- body aches, cough- sore throat- tickle a couple days ago but work up today worse    Complains of a 2 day history of body aches, headache, post nasal drainage, sore throat, cough and chest congestion.   Close exposure to Covid positive person.   She has underlying asthma.  States she felt like she was having an asthma flare last night and so she used her albuterol.  She received all 3 Covid vaccines as well as flu vaccine  Denies fever, chills, dizziness, chest pain, palpitations, shortness of breath, abdominal pain, nausea, vomiting or diarrhea.    Review of Systems Pertinent positives and negatives in the history of present illness.     Objective:   Physical Exam Wt 110 lb (49.9 kg)   BMI 19.49 kg/m   Alert and oriented in no acute distress.  Respirations unlabored.  Speaking in complete senses without difficulty.      Assessment & Plan:  Body aches  Acute pharyngitis, unspecified etiology  Upper respiratory infection with cough and congestion  Close exposure to COVID-19 virus  She will come to the back of her building for Covid testing, rapid and PCR.  I will also test her for flu due to the acute onset in symptoms. She is fully vaccinated.  She does have underlying asthma so she  will continue using albuterol as needed. Discussed symptomatic management including Tylenol or ibuprofen, salt water gargles, Mucinex DM and multivitamins. We discussed quarantining and assuming she is positive until proven otherwise. Follow-up pending results.

## 2020-12-17 NOTE — Addendum Note (Signed)
Addended by: Girtha Rm on: 12/17/2020 10:41 AM   Modules accepted: Level of Service

## 2020-12-20 ENCOUNTER — Telehealth: Payer: Self-pay | Admitting: Family Medicine

## 2020-12-20 NOTE — Telephone Encounter (Signed)
Pt need covid positive result emailed to [email or web address] for her employer [employer].  Sent.

## 2020-12-24 ENCOUNTER — Encounter: Payer: Self-pay | Admitting: Family Medicine

## 2020-12-24 ENCOUNTER — Other Ambulatory Visit: Payer: Self-pay | Admitting: Family Medicine

## 2020-12-24 ENCOUNTER — Telehealth (INDEPENDENT_AMBULATORY_CARE_PROVIDER_SITE_OTHER): Payer: PPO | Admitting: Family Medicine

## 2020-12-24 VITALS — Ht 63.0 in | Wt 110.0 lb

## 2020-12-24 DIAGNOSIS — J45901 Unspecified asthma with (acute) exacerbation: Secondary | ICD-10-CM

## 2020-12-24 DIAGNOSIS — U071 COVID-19: Secondary | ICD-10-CM

## 2020-12-24 DIAGNOSIS — R059 Cough, unspecified: Secondary | ICD-10-CM

## 2020-12-24 MED ORDER — ALBUTEROL SULFATE HFA 108 (90 BASE) MCG/ACT IN AERS: 2.0000 | INHALATION_SPRAY | Freq: Four times a day (QID) | RESPIRATORY_TRACT | 0 refills | Status: DC | PRN

## 2020-12-24 MED ORDER — BENZONATATE 200 MG PO CAPS: 200.0000 mg | ORAL_CAPSULE | Freq: Three times a day (TID) | ORAL | 0 refills | Status: DC | PRN

## 2020-12-24 NOTE — Patient Instructions (Signed)
Stay well hydrated, drink lots of water.  Increase your Mucinex DM to the maximum dosage (I believe this is 2 pills twice daily (every 12 hours) but look at what the box says). Restart Symbicort--2 puffs twice daily, rinsing mouth/throat after using. We discussed taking sudafed during the day to help with runny nose and congestion, and possibly using benadryl at bedtime (if the sudafed keeps you awake; benadryl often is sedating, so will help with runny nose and might also help you sleep).  Take the prescription benzonatate up to three times daily if needed for cough. Use the albuterol as needed for any wheezing/shortness of breath, if needed, between doses of symbicort.  Touch base with [place] in the next 1-2 days to let us know if you're improving with these measures. Go to the emergency room if you develop chest pain, severe shortness of breath, pain with breathing (different from soreness from coughing), leg pain and swelling, or other new, worrisome symptoms.  I hope you feel better soon!

## 2020-12-24 NOTE — Progress Notes (Signed)
Did she get her my chart message and result?

## 2020-12-24 NOTE — Progress Notes (Signed)
Start time: 2:52 End time: 3:31  Virtual Visit via Video Note  I connected with Deborah Bray on 12/24/20 by a video enabled telemedicine application and verified that I am speaking with the correct person using two identifiers.  Location: Patient: home Provider: office   I discussed the limitations of evaluation and management by telemedicine and the availability of in person appointments. The patient expressed understanding and agreed to proceed.  History of Present Illness:  Chief Complaint  Patient presents with  . Sore Throat    VIRTUAL still having ST, SOB, muscle aches and HA. Having pressure in the middle of her chest that is off and on. Runny nose and throat clearing. Muscles aches tend to be in her neck and cervical spine.    Seen by Loletha Carrow 1/31 and diagnosed with COVID. She was improving after the first couple of days, but reports that her improvement hit a plateau.  She has persistent nasal drainage, cough, feels wheezy, + shortness of breath, fatigue.  Sore around the sides/back of the neck. 2 days ago she noted a discomfort in his sternum (pressure) when she lays flat. She can stay flat, makes it a little harder to breathe. Runny nose--watery. Throat/nose get dry at night, gets more sore throat.  Shortness of breath is constant. Slightly worse than originally. Seemed more like asthma at first, inhaler helped.  Now the inhaler isn't helping, stopped using it. (symbicort; doesn't have albuterol).  Occasionally getting up white phlegm, pretty thick. She has taken Mucinex-DM 12 hour tablet once daily. (600mg )  She started having some leg cramps in the last couple of days. Denies any swelling.  No fevers, dizziness.  Dull headache in the morning, at the back of the neck. Admits she has been on the computer a lot, looking down. No sinus pain.  No nausea or vomiting.  Mild diarrhea off/on. No loss of taste or smell. No rash  PMH, PSH, SH reviewed  Drinking less  ETOH during illness   Outpatient Encounter Medications as of 12/24/2020  Medication Sig  . albuterol (VENTOLIN HFA) 108 (90 Base) MCG/ACT inhaler Inhale 2 puffs into the lungs every 6 (six) hours as needed for wheezing or shortness of breath.  . benzonatate (TESSALON) 200 MG capsule Take 1 capsule (200 mg total) by mouth 3 (three) times daily as needed for cough.  . Misc Natural Products (ELDERBERRY ZINC/VIT C/IMMUNE MT) Use as directed 1 tablet in the mouth or throat daily.  . Omega-3 Fatty Acids (FISH OIL) 1000 MG CAPS Take by mouth.  . sertraline (ZOLOFT) 100 MG tablet Take 100 mg by mouth daily.  . budesonide-formoterol (SYMBICORT) 80-4.5 MCG/ACT inhaler Inhale 2 puffs into the lungs 2 (two) times daily as needed (asthma).  (Patient not taking: Reported on 12/24/2020)  . [DISCONTINUED] albuterol (PROVENTIL HFA;VENTOLIN HFA) 108 (90 Base) MCG/ACT inhaler Inhale 2 puffs into the lungs every 6 (six) hours as needed for wheezing or shortness of breath. (Patient not taking: No sig reported)   No facility-administered encounter medications on file as of 12/24/2020.   Allergies  Allergen Reactions  . Bee Venom Anaphylaxis and Swelling  . Shellfish Allergy Anaphylaxis  . Codeine Nausea And Vomiting  . Sulfonamide Derivatives Nausea And Vomiting  . Tetracycline     Unsure about this med 08/28/15   ROS: as detailed in HPI. See above.    Observations/Objective:  Ht 5\' 3"  (1.6 m)   Wt 110 lb (49.9 kg)   BMI 19.49 kg/m   Well-appearing  female.  She is coughing sporadically throughout the visit--wet-sounding and hacky. She is speaking easily, in no distress. She is alert, oriented. Exam is limited due to virtual nature of the visit.   Assessment and Plan:  COVID-19 virus infection - some worsening shortness of breath, but inadequately treating her congestion. Reviewed supportive measures and Ddx; may need further eval if persists/worsens  Exacerbation of asthma, unspecified asthma  severity, unspecified whether persistent - take symbicort 2P BID, rinse mouth after, and use albuterol prn. consider steroids (but will try adequate inhalation therapy first) - Plan: albuterol (VENTOLIN HFA) 108 (90 Base) MCG/ACT inhaler  Cough - Plan: benzonatate (TESSALON) 200 MG capsule   Albuterol Increase Mucinex DM to max dosage (2 tablets twice daily) Restart Symbicort BID (not prn). Tessalon Fluid/hydrate, bland diet Sudafed during the day and/or benadryl at night to help with runny nose and PND  F/u 1-2 days with update; additional eval may be needed if persistent/worsening shortness of breath, chest pain, PND or other symptoms. Ddx reviewed in detail--may need further eval with CXR, poss labs, depending on how symptoms change in the next few days.   Follow Up Instructions:    I discussed the assessment and treatment plan with the patient. The patient was provided an opportunity to ask questions and all were answered. The patient agreed with the plan and demonstrated an understanding of the instructions.   The patient was advised to call back or seek an in-person evaluation if the symptoms worsen or if the condition fails to improve as anticipated.  I spent 41 minutes dedicated to the care of this patient, including pre-visit review of records, face to face time, post-visit ordering of testing and documentation.    Vikki Ports, MD

## 2020-12-27 ENCOUNTER — Encounter: Payer: Self-pay | Admitting: Family Medicine

## 2020-12-28 ENCOUNTER — Encounter: Payer: Self-pay | Admitting: Internal Medicine

## 2020-12-28 ENCOUNTER — Telehealth: Payer: Self-pay | Admitting: Family Medicine

## 2020-12-28 NOTE — Telephone Encounter (Signed)
Pt said she was diagnosed with Covid on 1-31 and is still having mild sore throat and coughing. Her job is wanting her to return to work on Monday and she wanted to know would that be ok and if she is still contagious?

## 2020-12-28 NOTE — Telephone Encounter (Signed)
Sent a message through my chart

## 2020-12-28 NOTE — Telephone Encounter (Signed)
She should not be contagious at this point. I am ok with her returning to work. The cough can linger for several weeks.

## 2021-01-22 ENCOUNTER — Telehealth: Payer: Self-pay | Admitting: Family Medicine

## 2021-01-22 NOTE — Telephone Encounter (Signed)
Left message for pt to call back concerning Prolia.   ALSO pt needs to schedule a MWV due in May

## 2021-01-28 DIAGNOSIS — L723 Sebaceous cyst: Secondary | ICD-10-CM | POA: Diagnosis not present

## 2021-01-28 DIAGNOSIS — L570 Actinic keratosis: Secondary | ICD-10-CM | POA: Diagnosis not present

## 2021-02-04 DIAGNOSIS — M25551 Pain in right hip: Secondary | ICD-10-CM | POA: Diagnosis not present

## 2021-02-04 DIAGNOSIS — M1611 Unilateral primary osteoarthritis, right hip: Secondary | ICD-10-CM | POA: Diagnosis not present

## 2021-02-13 DIAGNOSIS — M25551 Pain in right hip: Secondary | ICD-10-CM | POA: Diagnosis not present

## 2021-02-14 ENCOUNTER — Other Ambulatory Visit: Payer: PPO

## 2021-02-14 ENCOUNTER — Other Ambulatory Visit: Payer: Self-pay

## 2021-02-14 ENCOUNTER — Other Ambulatory Visit: Payer: Self-pay | Admitting: Family Medicine

## 2021-02-14 DIAGNOSIS — M810 Age-related osteoporosis without current pathological fracture: Secondary | ICD-10-CM

## 2021-02-14 MED ORDER — DENOSUMAB 60 MG/ML ~~LOC~~ SOSY
60.0000 mg | PREFILLED_SYRINGE | Freq: Once | SUBCUTANEOUS | Status: AC
  Administered 2021-02-14: 60 mg via SUBCUTANEOUS

## 2021-02-14 NOTE — Telephone Encounter (Signed)
Deborah Bray does this need to be filled. Pt had recent prolia shot.

## 2021-02-15 NOTE — Telephone Encounter (Signed)
This does not need to be filled per [facility]

## 2021-02-20 DIAGNOSIS — M1611 Unilateral primary osteoarthritis, right hip: Secondary | ICD-10-CM | POA: Diagnosis not present

## 2021-02-20 DIAGNOSIS — M5136 Other intervertebral disc degeneration, lumbar region: Secondary | ICD-10-CM | POA: Diagnosis not present

## 2021-02-25 ENCOUNTER — Other Ambulatory Visit (HOSPITAL_COMMUNITY): Payer: Self-pay

## 2021-03-07 ENCOUNTER — Telehealth: Payer: Self-pay | Admitting: Internal Medicine

## 2021-03-07 NOTE — Telephone Encounter (Signed)
Called to do metric screenings on patient. Left message for pt to call me back

## 2021-03-07 NOTE — Telephone Encounter (Signed)
Pt was asked fall depression screening questions. No both. And BMI not clinically done

## 2021-03-08 DIAGNOSIS — L570 Actinic keratosis: Secondary | ICD-10-CM | POA: Diagnosis not present

## 2021-04-24 NOTE — Progress Notes (Signed)
[place] [place] Phone: [phone] Subjective:   Fontaine No, am serving as a scribe for Dr. Hulan Saas.  This visit occurred during the SARS-CoV-2 public health emergency.  Safety protocols were in place, including screening questions prior to the visit, additional usage of staff PPE, and extensive cleaning of exam room while observing appropriate contact time as indicated for disinfecting solutions.   I'm seeing this patient by the request  of:  Girtha Rm, NP-C  CC: Right hip pain  [id number]  Deborah Bray is a 70 y.o. female coming in with complaint of back and R hip pain. Back pain is more soreness. Lumbar fusion L4/L5, 12 years ago.   Pain over greater trochanter that can radiate into lower back, quad, and glute. Patient stands at work all day and this increases her pain. Is able to walk without pain. Pain can be very bad at times. Has had epidurals and GT injections that did not help. Also tried acupuncture and stretching. Is going to [place] later this year.   Lumbar xray 2017 IMPRESSION: 1. No fracture or acute finding. 2. No evidence of loosening of the L4-L5 posterior lumbar spine fusion hardware. 3. Degenerative changes as described. No significant change compared to the prior study.  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   Ossifications in the right hamstrings at their origin is most likely  due to prior strain. No tear is identified.   Status post lower lumbar fusion.        Past Medical History:  Diagnosis Date   Asthma    allergy induced   CIN I (cervical intraepithelial neoplasia I)    CIN III (cervical intraepithelial neoplasia III)    Depression    Elevated cholesterol    Encounter for screening for lung cancer 12/08/2017   HEADACHE 03/07/2008    Qualifier: Diagnosis of  By: Sherlynn Stalls, CMA, Cindy     Nasal fracture    Osteoporosis    Past Surgical History:  Procedure Laterality Date   BACK SURGERY     CERVICAL BIOPSY  W/ LOOP ELECTRODE EXCISION     CLOSED REDUCTION NASAL FRACTURE N/A 09/19/2016   Procedure: CLOSED REDUCTION NASAL FRACTURE;  Surgeon: Rozetta Nunnery, MD;  Location: [facility];  Service: ENT;  Laterality: N/A;   COLPOSCOPY     GYNECOLOGIC CRYOSURGERY     KNEE SURGERY     Arthroscopic X 3   NOSE SURGERY     Squamous cell excised     Lip   TURBINATE REDUCTION Bilateral 09/19/2016   Procedure: TURBINATE REDUCTION;  Surgeon: Rozetta Nunnery, MD;  Location: [facility];  Service: ENT;  Laterality: Bilateral;   vocal cord surg     Social History   Socioeconomic History   Marital status: Single    Spouse name: Not on file   Number of children: Not on file   Years of education: Not on file   Highest education level: Not on file  Occupational History   Not on file  Tobacco Use   Smoking status: Former    Packs/day: 4.00    Years: 15.00    Pack years: 60.00    Types: Cigarettes    Quit date: 08/28/1999    Years since quitting: 21.6   Smokeless tobacco: Never  Vaping Use  Vaping Use: Never used  Substance and Sexual Activity   Alcohol use: Yes    Alcohol/week: 14.0 standard drinks    Types: 14 Glasses of wine per week    Comment: 2 glasses of wine daily   Drug use: Not Currently    Comment: in college- marijuana, mushrooms    Sexual activity: Not Currently    Birth control/protection: Post-menopausal    Comment: 1st intercourse 70 yo-More than 5 partners  Other Topics Concern   Not on file  Social History Narrative   Not on file   Social Determinants of Health   Financial Resource Strain: Not on file  Food Insecurity: Not on file  Transportation Needs: Not on file  Physical Activity: Not on file  Stress: Not on file  Social Connections: Not on file    Allergies  Allergen Reactions   Bee Venom Anaphylaxis and Swelling   Shellfish Allergy Anaphylaxis   Codeine Nausea And Vomiting   Sulfonamide Derivatives Nausea And Vomiting   Tetracycline     Unsure about this med 08/28/15   Family History  Problem Relation Age of Onset   Hypertension Mother    Diabetes Mother    Allergies Mother    Bowel Disease Mother    Glaucoma Mother    Heart attack Mother 30   Other Sister        Brain tumor-benign   Cancer Sister        BRAIN TUMOR   Cancer Brother        Throat cancer   Parkinson's disease Father       Current Outpatient Medications (Respiratory):    albuterol (VENTOLIN HFA) 108 (90 Base) MCG/ACT inhaler, Inhale 2 puffs into the lungs every 6 (six) hours as needed for wheezing or shortness of breath.   budesonide-formoterol (SYMBICORT) 80-4.5 MCG/ACT inhaler, Inhale 2 puffs into the lungs 2 (two) times daily as needed (asthma).   benzonatate (TESSALON) 200 MG capsule, Take 1 capsule (200 mg total) by mouth 3 (three) times daily as needed for cough.    Current Outpatient Medications (Other):    gabapentin (NEURONTIN) 100 MG capsule, Take 2 capsules (200 mg total) by mouth at bedtime.   Misc Natural Products (ELDERBERRY ZINC/VIT C/IMMUNE MT), Use as directed 1 tablet in the mouth or throat daily.   Omega-3 Fatty Acids (FISH OIL) 1000 MG CAPS, Take by mouth.   sertraline (ZOLOFT) 100 MG tablet, Take 100 mg by mouth daily.   Reviewed prior external information including notes and imaging from  primary care provider As well as notes that were available from care everywhere and other healthcare systems.  Past medical history, social, surgical and family history all reviewed in electronic medical record.  No pertanent information unless stated regarding to the chief complaint.   Review of Systems:  No headache, visual changes, nausea, vomiting, diarrhea, constipation, dizziness, abdominal pain, skin rash, fevers, chills, night  sweats, weight loss, swollen lymph nodes, body aches, joint swelling, chest pain, shortness of breath, mood changes. POSITIVE muscle aches  Objective  Blood pressure 128/84, pulse 88, height 5\' 3"  (1.6 m), weight 110 lb (49.9 kg), SpO2 96 %.   General: No apparent distress alert and oriented x3 mood and affect normal, dressed appropriately.  HEENT: Pupils equal, extraocular movements intact  Respiratory: Patient's speak in full sentences and does not appear short of breath  Cardiovascular: No lower extremity edema, non tender, no erythema  Gait antalgic with noticeable weakness of the girdle on the right  side. MSK: Patient's low back does have some mild loss of lordosis.  Patient does have some tightness noted with straight leg test but patient denies any true radicular symptoms but patient does have worsening pain with potential radicular symptoms with standing long amount of time or extension of the back.  Patient does have weakness noted of the pelvic girdle on the right side.  Positive Trendelenburg noted.  Patient does have very mild limited internal rotation of the hip but the pain is not in the groin area and is more on the lateral aspect.  Patient has very minimal bony tenderness noted over the greater trochanteric area.  Negative FABER test.  Mild pain over the sacroiliac joint.    Impression and Recommendations:     The above documentation has been reviewed and is accurate and complete Lyndal Pulley, DO

## 2021-04-25 ENCOUNTER — Ambulatory Visit (INDEPENDENT_AMBULATORY_CARE_PROVIDER_SITE_OTHER): Payer: PPO

## 2021-04-25 ENCOUNTER — Ambulatory Visit: Payer: PPO | Admitting: Family Medicine

## 2021-04-25 ENCOUNTER — Encounter: Payer: Self-pay | Admitting: Family Medicine

## 2021-04-25 ENCOUNTER — Other Ambulatory Visit: Payer: Self-pay

## 2021-04-25 VITALS — BP 128/84 | HR 88 | Ht 63.0 in | Wt 110.0 lb

## 2021-04-25 DIAGNOSIS — M545 Low back pain, unspecified: Secondary | ICD-10-CM

## 2021-04-25 DIAGNOSIS — G8929 Other chronic pain: Secondary | ICD-10-CM | POA: Diagnosis not present

## 2021-04-25 MED ORDER — GABAPENTIN 100 MG PO CAPS
200.0000 mg | ORAL_CAPSULE | Freq: Every day | ORAL | 3 refills | Status: DC
Start: 2021-04-25 — End: 2022-05-23

## 2021-04-25 NOTE — Assessment & Plan Note (Signed)
Patient is having hip pain and I am more concerned that seems to be secondary to back.  Patient has signs and symptoms consistent with L3-L4 and L4-L5 spinal stenosis.  Patient states that the pain is worse with standing and better with activity.  Patient on quick ultrasound that was unable to be saved shows some very mild intersubstance tearing of the gluteal tendon but will hold on treatment for this.  Do feel advanced imaging with patient having weakness of the hip girdle on the right side for the lumbar spine would be beneficial.  Depending on findings I am expecting some adjacent segment disease and spinal stenosis and see if patient could potentially respond well to other epidurals.  Patient also will be started on gabapentin and given some home exercises.  Follow-up with me again 4 to 6 weeks.

## 2021-04-25 NOTE — Patient Instructions (Addendum)
Good to see you Gabapentin 100 mg at night you can increase to 200 mg Back xray MRI of the back We will write you in my chart

## 2021-04-30 ENCOUNTER — Encounter: Payer: Self-pay | Admitting: Family Medicine

## 2021-04-30 ENCOUNTER — Other Ambulatory Visit: Payer: Self-pay

## 2021-04-30 ENCOUNTER — Ambulatory Visit (INDEPENDENT_AMBULATORY_CARE_PROVIDER_SITE_OTHER): Payer: PPO | Admitting: Family Medicine

## 2021-04-30 VITALS — BP 120/64 | HR 76 | Ht 64.0 in | Wt 111.6 lb

## 2021-04-30 DIAGNOSIS — Z5320 Procedure and treatment not carried out because of patient's decision for unspecified reasons: Secondary | ICD-10-CM | POA: Diagnosis not present

## 2021-04-30 DIAGNOSIS — Z0000 Encounter for general adult medical examination without abnormal findings: Secondary | ICD-10-CM | POA: Diagnosis not present

## 2021-04-30 DIAGNOSIS — J4540 Moderate persistent asthma, uncomplicated: Secondary | ICD-10-CM

## 2021-04-30 DIAGNOSIS — E7800 Pure hypercholesterolemia, unspecified: Secondary | ICD-10-CM

## 2021-04-30 DIAGNOSIS — R7989 Other specified abnormal findings of blood chemistry: Secondary | ICD-10-CM | POA: Diagnosis not present

## 2021-04-30 DIAGNOSIS — Z7189 Other specified counseling: Secondary | ICD-10-CM | POA: Diagnosis not present

## 2021-04-30 DIAGNOSIS — M810 Age-related osteoporosis without current pathological fracture: Secondary | ICD-10-CM

## 2021-04-30 DIAGNOSIS — E559 Vitamin D deficiency, unspecified: Secondary | ICD-10-CM | POA: Diagnosis not present

## 2021-04-30 NOTE — Patient Instructions (Signed)
  Ms. Kamrowski , Thank you for taking time to come for your Medicare Wellness Visit. I appreciate your ongoing commitment to your health goals. Please review the following plan we discussed and let me know if I can assist you in the future.   These are the goals we discussed:  You will be due for your next bone density in September 2020. You can have this done at the [facility] if it was the same place as last time.   Make sure your Tdap is up to date and if not go to the local pharmacy to get it.   You qualify for the Shingrix vaccine (2 shots) and can get this at your pharmacy.   Consider the 4th Covid vaccine.   Have a conversation with your niece regarding your advance directives.     This is a list of the screening recommended for you and due dates:  Health Maintenance  Topic Date Due   Zoster (Shingles) Vaccine (1 of 2) Never done   Tetanus Vaccine  11/17/2010   COVID-19 Vaccine (3 - Moderna risk series) 03/14/2020   Flu Shot  06/17/2021   DEXA scan (bone density measurement)  Completed   Hepatitis C Screening: USPSTF Recommendation to screen - Ages 89-79 yo.  Completed   Pneumonia vaccines  Completed   HPV Vaccine  Aged Out   Mammogram  Discontinued   Colon Cancer Screening  Discontinued

## 2021-04-30 NOTE — Progress Notes (Signed)
Deborah Bray is a 70 y.o. female who presents for annual wellness visit, CPE and follow-up on chronic medical conditions.  She has the following concerns:  Working at [employer], in [employer].   Osteoporosis- taking vitamin D, Prolia injections  GERD with Fosamax. Active. Getting calcium in her diet   States her mood is good. Doing well on sertraline and no depression since going back to work.   Asthma- using Symbicort most days and has not had flare up or needed albuterol.   Dr. Tamala Julian- managing her chronic low back pain   Hx of hyperkalemia and is not eating bananas     Immunization History  Administered Date(s) Administered   Hepatitis A 08/05/1999, 02/07/2000   Hepatitis B 08/05/1999, 09/06/1999, 02/07/2000   Influenza Whole 08/22/2010   Influenza, High Dose Seasonal PF 08/28/2015, 08/31/2018   Influenza,inj,Quad PF,6+ Mos 09/11/2014   Moderna Sars-Covid-2 Vaccination 01/19/2020, 02/15/2020   OPV 08/05/1999   Pneumococcal Conjugate-13 08/28/2015   Pneumococcal Polysaccharide-23 01/23/2017   Td 11/17/2000   Last Pap smear: 2021 Last mammogram: declines mammogram Last colonoscopy: declines as well as Cologuard  Last DEXA: 2020 Dentist: Dr. Barbaraann Faster Ophtho: Dr. Idolina Primer Exercise: walking 2 miles a day  Other doctors caring for patient include:  Dr.Zachary Tamala Julian- ortho- back Dr. Lucianne Lei wrinkle- allergist Dermatologist   Depression screen:  See questionnaire below.  Depression screen [facility] 2/9 04/30/2021 03/07/2021 04/02/2020 08/30/2018 01/23/2017  Decreased Interest 0 0 0 0 1  Down, Depressed, Hopeless 0 0 0 0 1  PHQ - 2 Score 0 0 0 0 2  Altered sleeping 0 - - - 3  Tired, decreased energy 0 - - - -  Change in appetite 0 - - - 0  Feeling bad or failure about yourself  0 - - - 0  Trouble concentrating 0 - - - 0  Moving slowly or fidgety/restless 0 - - - 0  Suicidal thoughts 0 - - - 0  PHQ-9 Score 0 - - - 5  Difficult doing work/chores Not difficult at all - - - Not  difficult at all    Fall Risk Screen: see questionnaire below. Fall Risk  04/30/2021 03/07/2021 04/02/2020 08/30/2018 01/23/2017  Falls in the past year? 0 0 1 Yes No  Comment - - - - -  Number falls in past yr: 0 0 1 1 -  Injury with Fall? 0 0 0 Yes -  Risk for fall due to : No Fall Risks No Fall Risks - Other (Comment) -  Follow up Falls evaluation completed Falls evaluation completed - - -    ADL screen:  See questionnaire below Functional Status Survey: Is the patient deaf or have difficulty hearing?: No Does the patient have difficulty seeing, even when wearing glasses/contacts?: No Does the patient have difficulty concentrating, remembering, or making decisions?: No Does the patient have difficulty walking or climbing stairs?: No Does the patient have difficulty dressing or bathing?: No Does the patient have difficulty doing errands alone such as visiting a doctor's office or shopping?: No   End of Life Discussion:  Patient does not have a living will and medical power of attorney. Her niece who is a Careers information officer at [employer], Deborah Bray will be her HCPOA.   Review of Systems Constitutional: -fever, -chills, -sweats, -unexpected weight change, -anorexia, -fatigue Allergy: -sneezing, -itching, -congestion Dermatology: denies changing moles, rash, lumps, new worrisome lesions ENT: -runny nose, -ear pain, -sore throat, -hoarseness, -sinus pain, -teeth pain, -tinnitus, -hearing loss, -  epistaxis Cardiology:  -chest pain, -palpitations, -edema, -orthopnea, -paroxysmal nocturnal dyspnea Respiratory: -cough, -shortness of breath, -dyspnea on exertion, -wheezing, -hemoptysis Gastroenterology: -abdominal pain, -nausea, -vomiting, -diarrhea, -constipation, -blood in stool, -changes in bowel movement, -dysphagia Hematology: -bleeding or bruising problems Musculoskeletal: -arthralgias, -myalgias, -joint swelling, +back pain, -neck pain, -cramping, -gait changes Ophthalmology:  -vision changes, -eye redness, -itching, -discharge Urology: -dysuria, -difficulty urinating, -hematuria, -urinary frequency, -urgency, -incontinence Neurology: -headache, -weakness, -tingling, -numbness, -speech abnormality, -memory loss, -falls, -dizziness Psychology:  -depressed mood, -agitation, -sleep problems    PHYSICAL EXAM:  BP 120/64   Pulse 76   Ht 5\' 4"  (1.626 m)   Wt 111 lb 9.6 oz (50.6 kg)   SpO2 97%   BMI 19.16 kg/m   General Appearance: Alert, cooperative, no distress, appears stated age Head: Normocephalic, without obvious abnormality, atraumatic Eyes: PERRL, conjunctiva/corneas clear, EOM's intact Ears: Normal TM's and external ear canals Nose: mask on  Throat: mask on  Neck: Supple, no lymphadenopathy; thyroid: no enlargement/tenderness/nodules; no JVD Back: ROM normal Lungs: Clear to auscultation bilaterally without wheezes, rales or ronchi; respirations unlabored Chest Wall: No tenderness or deformity Heart: Regular rate and rhythm, S1 and S2 normal, no murmur, rub or gallop Breast Exam: declines  Abdomen: Soft, non-tender, nondistended, normoactive bowel sounds, no masses, no hepatosplenomegaly Genitalia: declines  Extremities: No clubbing, cyanosis or edema Pulses: 2+ and symmetric all extremities Skin: Skin color, texture, turgor normal, no rashes or lesions Lymph nodes: Cervical, supraclavicular, and axillary nodes normal Neurologic: CNII-XII intact, normal strength, sensation and gait Psych: Normal mood, affect, hygiene and grooming.  ASSESSMENT/PLAN: Medicare annual wellness visit, subsequent -Denies any concerns with memory, mood, ADLs and no falls.  Medications reviewed.  Administrative counseling done.  Routine general medical examination at a health care facility - Plan: CBC with Differential/Platelet, Comprehensive metabolic panel, TSH, T4, free, T3 -Preventive health care reviewed and she declines to have mammogram and colonoscopy done.  She  will be due for her DEXA this fall.  Counseling on healthy lifestyle including diet and exercise.  Immunizations reviewed.  Discussed safety and health promotion.  Age-related osteoporosis without current pathological fracture - Plan: VITAMIN D 25 Hydroxy (Vit-D Deficiency, Fractures) -She will continue getting calcium in her diet, taking a vitamin D supplement and doing weightbearing exercises.  Continue with Prolia since she is not having any side effects.  She will need repeat DEXA September 2022  Elevated LDL cholesterol level - Plan: Lipid panel -Recommend low-fat diet.  Follow-up pending lipid panel results  Vitamin D deficiency - Plan: VITAMIN D 25 Hydroxy (Vit-D Deficiency, Fractures) -Follow-up pending vitamin D level  Advance directive discussed with patient -Recommend she have a conversation with her niece who will be her healthcare power of attorney.  Forms provided  Mammogram declined -She is aware that she could have breast cancer and states she would not do anything about it if she were to find out that she had breast cancer.  Moderate persistent asthma without complication -No flares.  Continue daily maintenance inhaler  Elevated TSH - Plan: TSH, T4, free, T3    Discussed monthly self breast exams and yearly mammograms; at least 30 minutes of aerobic activity at least 5 days/week and weight-bearing exercise 2x/week; proper sunscreen use reviewed; healthy diet, including goals of calcium and vitamin D intake and alcohol recommendations (less than or equal to 1 drink/day) reviewed; regular seatbelt use; changing batteries in smoke detectors.  Immunization recommendations discussed.  Colonoscopy recommendations reviewed   Medicare Attestation I have personally  reviewed: The patient's medical and social history Their use of alcohol, tobacco or illicit drugs Their current medications and supplements The patient's functional ability including ADLs,fall risks, home safety  risks, cognitive, and hearing and visual impairment Diet and physical activities Evidence for depression or mood disorders  The patient's weight, height, and BMI have been recorded in the chart.  I have made referrals, counseling, and provided education to the patient based on review of the above and I have provided the patient with a written personalized care plan for preventive services.     Harland Dingwall, NP-C   04/30/2021

## 2021-05-01 LAB — COMPREHENSIVE METABOLIC PANEL
ALT: 10 IU/L (ref 0–32)
AST: 20 IU/L (ref 0–40)
Albumin/Globulin Ratio: 1.9 (ref 1.2–2.2)
Albumin: 4.6 g/dL (ref 3.7–4.7)
Alkaline Phosphatase: 56 IU/L (ref 44–121)
BUN/Creatinine Ratio: 24 (ref 12–28)
BUN: 21 mg/dL (ref 8–27)
Bilirubin Total: 0.5 mg/dL (ref 0.0–1.2)
CO2: 24 mmol/L (ref 20–29)
Calcium: 9.9 mg/dL (ref 8.7–10.3)
Chloride: 102 mmol/L (ref 96–106)
Creatinine, Ser: 0.87 mg/dL (ref 0.57–1.00)
Globulin, Total: 2.4 g/dL (ref 1.5–4.5)
Glucose: 90 mg/dL (ref 65–99)
Potassium: 5.5 mmol/L — ABNORMAL HIGH (ref 3.5–5.2)
Sodium: 142 mmol/L (ref 134–144)
Total Protein: 7 g/dL (ref 6.0–8.5)
eGFR: 71 mL/min/{1.73_m2} (ref >59)

## 2021-05-01 LAB — CBC WITH DIFFERENTIAL/PLATELET
Basophils Absolute: 0 10*3/uL (ref 0.0–0.2)
Basos: 1 %
EOS (ABSOLUTE): 0.6 10*3/uL — ABNORMAL HIGH (ref 0.0–0.4)
Eos: 11 %
Hematocrit: 40.3 % (ref 34.0–46.6)
Hemoglobin: 13.8 g/dL (ref 11.1–15.9)
Immature Grans (Abs): 0 10*3/uL (ref 0.0–0.1)
Immature Granulocytes: 1 %
Lymphocytes Absolute: 1.6 10*3/uL (ref 0.7–3.1)
Lymphs: 29 %
MCH: 32.3 pg (ref 26.6–33.0)
MCHC: 34.2 g/dL (ref 31.5–35.7)
MCV: 94 fL (ref 79–97)
Monocytes Absolute: 0.5 10*3/uL (ref 0.1–0.9)
Monocytes: 10 %
Neutrophils Absolute: 2.7 10*3/uL (ref 1.4–7.0)
Neutrophils: 48 %
Platelets: 286 10*3/uL (ref 150–450)
RBC: 4.27 x10E6/uL (ref 3.77–5.28)
RDW: 11.6 % — ABNORMAL LOW (ref 11.7–15.4)
WBC: 5.5 10*3/uL (ref 3.4–10.8)

## 2021-05-01 LAB — TSH: TSH: 3.2 u[IU]/mL (ref 0.450–4.500)

## 2021-05-01 LAB — T3: T3, Total: 82 ng/dL (ref 71–180)

## 2021-05-01 LAB — LIPID PANEL
Chol/HDL Ratio: 1.8 ratio (ref 0.0–4.4)
Cholesterol, Total: 255 mg/dL — ABNORMAL HIGH (ref 100–199)
HDL: 138 mg/dL (ref >39)
LDL Chol Calc (NIH): 108 mg/dL — ABNORMAL HIGH (ref 0–99)
Triglycerides: 57 mg/dL (ref 0–149)
VLDL Cholesterol Cal: 9 mg/dL (ref 5–40)

## 2021-05-01 LAB — T4, FREE: Free T4: 0.83 ng/dL (ref 0.82–1.77)

## 2021-05-01 LAB — VITAMIN D 25 HYDROXY (VIT D DEFICIENCY, FRACTURES): Vit D, 25-Hydroxy: 52.2 ng/mL (ref 30.0–100.0)

## 2021-05-14 DIAGNOSIS — H1045 Other chronic allergic conjunctivitis: Secondary | ICD-10-CM | POA: Diagnosis not present

## 2021-06-25 DIAGNOSIS — Z85820 Personal history of malignant melanoma of skin: Secondary | ICD-10-CM | POA: Diagnosis not present

## 2021-06-25 DIAGNOSIS — L570 Actinic keratosis: Secondary | ICD-10-CM | POA: Diagnosis not present

## 2021-06-25 DIAGNOSIS — Z85828 Personal history of other malignant neoplasm of skin: Secondary | ICD-10-CM | POA: Diagnosis not present

## 2021-06-25 DIAGNOSIS — B078 Other viral warts: Secondary | ICD-10-CM | POA: Diagnosis not present

## 2021-06-25 DIAGNOSIS — D225 Melanocytic nevi of trunk: Secondary | ICD-10-CM | POA: Diagnosis not present

## 2021-06-25 DIAGNOSIS — C44612 Basal cell carcinoma of skin of right upper limb, including shoulder: Secondary | ICD-10-CM | POA: Diagnosis not present

## 2021-06-25 DIAGNOSIS — L821 Other seborrheic keratosis: Secondary | ICD-10-CM | POA: Diagnosis not present

## 2021-06-25 DIAGNOSIS — L814 Other melanin hyperpigmentation: Secondary | ICD-10-CM | POA: Diagnosis not present

## 2021-06-25 DIAGNOSIS — D485 Neoplasm of uncertain behavior of skin: Secondary | ICD-10-CM | POA: Diagnosis not present

## 2021-06-25 DIAGNOSIS — L820 Inflamed seborrheic keratosis: Secondary | ICD-10-CM | POA: Diagnosis not present

## 2021-07-09 ENCOUNTER — Other Ambulatory Visit (HOSPITAL_COMMUNITY): Payer: Self-pay

## 2021-07-29 DIAGNOSIS — C44519 Basal cell carcinoma of skin of other part of trunk: Secondary | ICD-10-CM | POA: Diagnosis not present

## 2021-08-08 ENCOUNTER — Telehealth: Payer: Self-pay | Admitting: Family Medicine

## 2021-08-08 NOTE — Telephone Encounter (Signed)
Called and left message for pt to return my call. I need to speak to her concerning her next Prolia injection.

## 2021-09-02 ENCOUNTER — Other Ambulatory Visit: Payer: Self-pay

## 2021-09-02 ENCOUNTER — Other Ambulatory Visit: Payer: PPO

## 2021-09-02 DIAGNOSIS — M8580 Other specified disorders of bone density and structure, unspecified site: Secondary | ICD-10-CM

## 2021-09-02 MED ORDER — DENOSUMAB 60 MG/ML ~~LOC~~ SOSY
60.0000 mg | PREFILLED_SYRINGE | Freq: Once | SUBCUTANEOUS | Status: AC
  Administered 2021-09-02: 60 mg via SUBCUTANEOUS

## 2021-10-09 ENCOUNTER — Other Ambulatory Visit (INDEPENDENT_AMBULATORY_CARE_PROVIDER_SITE_OTHER): Payer: PPO

## 2021-10-09 ENCOUNTER — Encounter: Payer: Self-pay | Admitting: Family Medicine

## 2021-10-09 ENCOUNTER — Telehealth (INDEPENDENT_AMBULATORY_CARE_PROVIDER_SITE_OTHER): Payer: PPO | Admitting: Family Medicine

## 2021-10-09 ENCOUNTER — Other Ambulatory Visit: Payer: Self-pay

## 2021-10-09 ENCOUNTER — Other Ambulatory Visit: Payer: Self-pay | Admitting: *Deleted

## 2021-10-09 VITALS — Temp 96.9°F | Ht 63.0 in | Wt 112.0 lb

## 2021-10-09 DIAGNOSIS — R519 Headache, unspecified: Secondary | ICD-10-CM

## 2021-10-09 DIAGNOSIS — R051 Acute cough: Secondary | ICD-10-CM | POA: Diagnosis not present

## 2021-10-09 DIAGNOSIS — R197 Diarrhea, unspecified: Secondary | ICD-10-CM

## 2021-10-09 DIAGNOSIS — R109 Unspecified abdominal pain: Secondary | ICD-10-CM | POA: Diagnosis not present

## 2021-10-09 DIAGNOSIS — R52 Pain, unspecified: Secondary | ICD-10-CM | POA: Diagnosis not present

## 2021-10-09 DIAGNOSIS — M545 Low back pain, unspecified: Secondary | ICD-10-CM

## 2021-10-09 DIAGNOSIS — R110 Nausea: Secondary | ICD-10-CM

## 2021-10-09 DIAGNOSIS — M791 Myalgia, unspecified site: Secondary | ICD-10-CM

## 2021-10-09 DIAGNOSIS — R1030 Lower abdominal pain, unspecified: Secondary | ICD-10-CM

## 2021-10-09 LAB — POCT INFLUENZA A/B
Influenza A, POC: NEGATIVE
Influenza B, POC: NEGATIVE

## 2021-10-09 LAB — POC COVID19 BINAXNOW: SARS Coronavirus 2 Ag: NEGATIVE

## 2021-10-09 MED ORDER — ONDANSETRON 4 MG PO TBDP: 4.0000 mg | ORAL_TABLET | Freq: Three times a day (TID) | ORAL | 0 refills | Status: DC | PRN

## 2021-10-09 NOTE — Patient Instructions (Addendum)
Drink plenty of fluid, stay well hydrated. Avoid dairy products until stools are more normal. Eat a bland diet (no spicy foods, nothing too acidic, fried, greasy). Rice, applesauce and toast (no butter--use jam or peanut butter) are easy on the stomach.  Thanks for the reminder to avoid lots of bananas due to your prior elevated potassium levels (usually suggested as a good food with diarrhea--ignore this in the handout about diarrhea).  Use tylenol for any pain (muscle, headache, stomach pain). You can try heating pad for the lower abdomen and low back. If this isn't helpful, you can take an anti-inflammatory (ibuprofen or aleve) if taken with food, and not bothering your stomach more (tylenol is preferred).  We cannot exclude that this is a very early diverticulitis. If you have increasing pain, fever, any blood in the stool, we will need to re-evaluate you. Hopefully you won't get worse over the holiday weekend (when we are closed).  If you aren't feeling better by Monday, please come into the office for a visit (we will check, urine, labs, and be able to perform a better physical exam).    The lower abdomen is also where you can have pain related to urinary tract, or GYN causes, so be sure to let us know if you develop any change in symptoms (vaginal discharge, bleeding, blood in the urine, etc).  High fiber diet is the mainstay of prevention of diverticulitis in patients with diverticulosis. I'll include additional information/handouts.  I hope you feel better soon.  Have a great Thanksgiving. Call us for a visit on Monday if you aren't better.

## 2021-10-09 NOTE — Progress Notes (Signed)
Start time: 10:31--didn't connect until 10:38 End time: 11:13  Virtual Visit via Video Note  I connected with Deborah Bray on 10/09/21 by a video enabled telemedicine application and verified that I am speaking with the correct person using two identifiers.  Location: Patient: home Provider: office   I discussed the limitations of evaluation and management by telemedicine and the availability of in person appointments. The patient expressed understanding and agreed to proceed.  History of Present Illness:  Chief Complaint  Patient presents with   Cough    VIRTUAL cough, congestion, body aches and abdominal cramping that started last Thursday. The abdominal cramping is getting worse. Also having LBP. No fever. Just doesn't feel like herself. The cough in her "usual" asthmatic cough. Has been getting very flushed. BP was high at dentist last week 156/80, which is high for her. She does not have a cuff at her house.    11/17, when she was in a dusty small room at [employer], she started having lower abdominal cramping, felt suddenly very hot, and had diarrhea (very loose stool, no blood/mucus). Heart was pounding.  She rushed home, then vomited.  Over the weekend she had off/on lower abdominal cramping (thinks similar to diverticulitis but not as bad--same location as then, which was across her lower stomach, was never left-sided).  She had ongoing loose stools. No further vomiting, didn't have a fever.  11/21 went back to work--had off/on cramping.  Stools were loose, not diarrhea, queasy stomach, "didn't feel right". Yesterday, worked 1/2 day. Cancelled derm appt due to feeling bad.  She has had ongoing fatigue, worsening cramping, queasy, headache, muscle aches. Muscle aches started 11/20--whole body, more in lower back also.  Denies spasms or cramps in muscles, just aches.  No urinary symptoms. Pain is across the low back, denies flank pain.  Some cream in coffee, no other dairy  since Thursday.  Had cheese casserole prior to onset of illness.    +sick contacts at work, family members have had stomach bugs, flu.  No direct COVID contact, but working with folks who have had it, some recently tested.   She has had some asthma in the last few weeks--felt related to the season, dust exposure--doesn't feel that her cough or congestion are out of the ordinary.  Denies discolored mucus or phlegm.  Denies sinus headaches. Having some posterior headaches.   Hasn't taken any OTC meds. Laying flat helps her back. She has been drinking more water, feels more thirsty.   PMH reviewed, along with PSH, SH. H/o diverticulitis, about 1.5 years ago. Similar pain location at that time. CT 03/2020: IMPRESSION: 1. Sigmoid diverticulosis with possible mild wall thickening of the sigmoid colon without adjacent inflammatory changes. This could indicate early mild colitis in the appropriate clinical setting. 2. Normal appendix in the right lower quadrant.    Outpatient Encounter Medications as of 10/09/2021  Medication Sig Note   budesonide-formoterol (SYMBICORT) 80-4.5 MCG/ACT inhaler Inhale 2 puffs into the lungs 2 (two) times daily as needed (asthma). 10/09/2021: Does not use every day-used yesterday   gabapentin (NEURONTIN) 100 MG capsule Take 2 capsules (200 mg total) by mouth at bedtime. (Patient taking differently: Take 100 mg by mouth daily.)    levocetirizine (XYZAL) 5 MG tablet Take 5 mg by mouth daily.    Omega-3 Fatty Acids (FISH OIL) 1000 MG CAPS Take by mouth.    sertraline (ZOLOFT) 100 MG tablet Take 100 mg by mouth daily.    albuterol (VENTOLIN  HFA) 108 (90 Base) MCG/ACT inhaler Inhale 2 puffs into the lungs every 6 (six) hours as needed for wheezing or shortness of breath. (Patient not taking: Reported on 10/09/2021) 10/09/2021: Uses prn   fluorometholone (FML) 0.1 % ophthalmic suspension SMARTSIG:In Eye(s) (Patient not taking: Reported on 10/09/2021) 10/09/2021: Uses  as needed   [DISCONTINUED] Misc Natural Products (ELDERBERRY ZINC/VIT C/IMMUNE MT) Use as directed 1 tablet in the mouth or throat daily.    No facility-administered encounter medications on file as of 10/09/2021.   Allergies  Allergen Reactions   Bee Venom Anaphylaxis and Swelling   Shellfish Allergy Anaphylaxis   Codeine Nausea And Vomiting   Sulfonamide Derivatives Nausea And Vomiting   Tetracycline     Unsure about this med 08/28/15      Observations/Objective:  Temp (!) 96.9 F (36.1 C) (Temporal)   Ht 5\' 3"  (1.6 m)   Wt 112 lb (50.8 kg)   BMI 19.84 kg/m   Pleasant, well-appearing female in no distress. She is alert and oriented.  Grossly normal cranial nerves. Occasional cough during visit.  She is speaking easily and comfortably She stood and palpated entire abdomen as directed by MD, there was no tenderness to touch. Exam is limited due to virtual nature of visit.   Assessment and Plan:   Abdominal cramping - pt with h/o diverticulitis, in ddx but sx mild. Poss AGE. May need re-eval (labs and/or imaging) if worsening  Nausea - Plan: ondansetron (ZOFRAN-ODT) 4 MG disintegrating tablet  Lower abdominal pain - Ddx reviewed.  Pt denies urinary symptoms.  Will need re-eval (in person) if persists/worsens  Bilateral low back pain without sciatica, unspecified chronicity  Myalgia  Acute cough - pt believes is typical for her this time of year with allergies. Given exposures and other symptoms, will check for COVID/flu  COVID/flu test today Discussed tylenol, NSAID, heating pad, avoiding dairy. BRAT diet--she reminded me issues with high K+, so should avoid bananas. Discussed sx that should seek immediate re-evaluation, otherwise f/u in office Monday if not improving.  High fiber diet for diverticulosis   Follow Up Instructions:    I discussed the assessment and treatment plan with the patient. The patient was provided an opportunity to ask questions and all  were answered. The patient agreed with the plan and demonstrated an understanding of the instructions.   The patient was advised to call back or seek an in-person evaluation if the symptoms worsen or if the condition fails to improve as anticipated.  I spent 33 minutes dedicated to the care of this patient, including pre-visit review of records, face to face time, post-visit ordering of testing and documentation.    Vikki Ports, MD

## 2021-10-11 ENCOUNTER — Encounter: Payer: Self-pay | Admitting: Family Medicine

## 2021-10-11 MED ORDER — HYOSCYAMINE SULFATE SL 0.125 MG SL SUBL: 1.0000 | SUBLINGUAL_TABLET | SUBLINGUAL | 0 refills | Status: DC | PRN

## 2021-10-14 ENCOUNTER — Other Ambulatory Visit: Payer: Self-pay

## 2021-10-14 ENCOUNTER — Encounter: Payer: Self-pay | Admitting: Family Medicine

## 2021-10-14 ENCOUNTER — Ambulatory Visit (INDEPENDENT_AMBULATORY_CARE_PROVIDER_SITE_OTHER): Payer: PPO | Admitting: Family Medicine

## 2021-10-14 VITALS — BP 100/70 | HR 76 | Temp 98.3°F | Ht 63.5 in | Wt 111.4 lb

## 2021-10-14 DIAGNOSIS — R1030 Lower abdominal pain, unspecified: Secondary | ICD-10-CM

## 2021-10-14 DIAGNOSIS — E875 Hyperkalemia: Secondary | ICD-10-CM

## 2021-10-14 LAB — POCT URINALYSIS DIP (PROADVANTAGE DEVICE)
Glucose, UA: NEGATIVE mg/dL
Leukocytes, UA: NEGATIVE
Nitrite, UA: NEGATIVE
Specific Gravity, Urine: 1.03
Urobilinogen, Ur: NEGATIVE
pH, UA: 5.5 (ref 5.0–8.0)

## 2021-10-14 NOTE — Progress Notes (Signed)
Chief Complaint  Patient presents with   Follow-up    Feels nauseous, has loss her sense of taste. Loss of appetite. Abdominal has gotten a lot better-but is taking the Levbid regularly, and took first ondansetron this morning. Has been having cold sweats.    Patient presents for follow-up on abdominal cramping, from virtual visit last week. Over the weekend her abdominal cramping got worse.  We sent in a prescription for anti-spasmodic.  That has been very helpful, taking it about every 6-7 hours.  Reports that she had been taking it somewhat preventatively at the 6-7 hour mark, but yesterday she did go 15-16 hours without any. This morning she had some recurrence of mild cramps.  Yesterday she noticed that she didn't taste her oatmeal--detected just a hint of salt.  Didn't really taste the [place] she ate last night. She had a negative COVID test here last week.  She hasn't had a bowel movement in several days, had been loose prior to that. She hasn't been eating much. Admits that she hasn't been drinking much water either. Denies any blood or mucus in the stool.  Myalgias lasted for a couple of days. Tylenol helped.  Hasn't had any myalgias in a couple of days.  Hasn't taken tylenol since yesterday morning. Cramps are under control, mild now, much improved. Now she is reporting more of the loss of appetite, queasy, tired. Some cold sweats yesterday and today, no fever. Feels tired, but not dizzy or weak.  No vomiting since 11/17, but has had nausea.  Took zofran this morning, which helped.  She also reports some breathing issues that feels different from her asthma.  She had some shortness of breath, used symbicort and it helped. Initially when cramps were severe, she had a hard time with her breathing.  That pain has subsided.  But she notices feeling more labored breathing even when walking the dog slowly.   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 They had only been tested early on, not re-tested.   PMH, PSH, SH reviewed  Outpatient Encounter Medications as of 10/14/2021  Medication Sig Note   albuterol (VENTOLIN HFA) 108 (90 Base) MCG/ACT inhaler Inhale 2 puffs into the lungs every 6 (six) hours as needed for wheezing or shortness of breath. 10/14/2021: Uses prn   budesonide-formoterol (SYMBICORT) 80-4.5 MCG/ACT inhaler Inhale 2 puffs into the lungs 2 (two) times daily as needed (asthma). 10/09/2021: Does not use every day-used yesterday   gabapentin (NEURONTIN) 100 MG capsule Take 2 capsules (200 mg total) by mouth at bedtime. (Patient taking differently: Take 100 mg by mouth daily.)    Hyoscyamine Sulfate SL (LEVSIN/SL) 0.125 MG SUBL Place 1 tablet under the tongue every 4 (four) hours as needed (abdominal cramping).    ondansetron (ZOFRAN-ODT) 4 MG disintegrating tablet Take 1 tablet (4 mg total) by mouth every 8 (eight) hours as needed for nausea or vomiting.    sertraline (ZOLOFT) 100 MG tablet Take 100 mg by mouth daily.    fluorometholone (FML) 0.1 % ophthalmic suspension SMARTSIG:In Eye(s) (Patient not taking: Reported on 10/09/2021) 10/09/2021: Uses as needed   levocetirizine (XYZAL) 5 MG tablet Take 5 mg by mouth daily. (Patient not taking: Reported on 10/14/2021)    Omega-3 Fatty Acids (FISH OIL) 1000 MG CAPS Take by mouth. (Patient not taking: Reported on 10/14/2021)    No facility-administered encounter medications on file as of 10/14/2021.   Allergies  Allergen  Reactions   Bee Venom Anaphylaxis and Swelling   Shellfish Allergy Anaphylaxis   Codeine Nausea And Vomiting   Sulfonamide Derivatives Nausea And Vomiting   Tetracycline     Unsure about this med 08/28/15   ROS: some cold sweats today and yesterday, no known fever. Decreased appetite, decreased taste, nausea, abdominal cramps, fatigue per HPI. Denies any urinary complaints. Denies any headache. No further myalgias. Some shortness of breath per  HPI    PHYSICAL EXAM:  BP 100/70   Pulse 76   Temp 98.3 F (36.8 C) (Tympanic)   Ht 5' 3.5" (1.613 m)   Wt 111 lb 6.4 oz (50.5 kg)   BMI 19.42 kg/m    Well-appearing, thin female in no distress HEENT; conjunctiva and sclera are clear, wearing mask. Mask pulled down and she was able to smell the soap on my hands Neck: no lymphadenopathy or mass Heart: regular rate and rhythm Lungs: clear bilaterally, no wheezes, rales, ronchi Abdomen: soft.  Very mild tenderness and LLQ and suprapubic. No mass, rebound or guarding. No organomegaly or mass Extremities: no edema Psych: normal mood, affect, hygiene and grooming Neuro: alert and oriented, normal gait, strength grossly intact.  Urine dip: urine was dark, cloudy, SG 1.030. Small bili, small ketones, trace blood, trace protein. Negative nitrite and leuks   ASSESSMENT/PLAN:  Lower abdominal pain - cramps improved with antispasmodic, no longer having diarrhea. Nausea, decreased appetite/taste, fatigue. Dehydrated - Plan: Comprehensive metabolic panel, CBC with Differential/Platelet, POCT Urinalysis DIP (Proadvantage Device)   Minimally tender on exam, nothing to make me worried about diverticulitis as I was with initial (virtual) visit (based on her somewhat atypical history of this type of pain with prior bout with diverticulitis). Will check labs given some persistent pain and nausea, and also due to fatigue and some DOE  Discussed loss of taste as COVID symptom--she had negative rapid test week (when had been symptomatic for a while).  She wasn't really sure about nausea vs true loss of taste. Hadn't eaten much.  Did appear to be somewhat dehydrated based on her SG of urine and fatigue. Reviewed proper hydration.  Aware of red flag symptoms for which she should seek more immediate care.  Continue prn use of levsin and zofran.  Checking CBC and c-met.

## 2021-10-14 NOTE — Patient Instructions (Signed)
Be sure to stay well hydrated. Try and eat something (non-dairy, bland), to keep your strength. Use the medication for cramping only if/when you have cramping. Continue the ondansetron if needed for severe nausea (especially if that helps you to be able to eat/drink).  Please contact us if you develop fever, changes in bowels (black stools, bloody stools), vomiting, worsening abdominal pain or other concerns.

## 2021-10-15 ENCOUNTER — Other Ambulatory Visit: Payer: PPO

## 2021-10-15 ENCOUNTER — Encounter: Payer: Self-pay | Admitting: Family Medicine

## 2021-10-15 DIAGNOSIS — E875 Hyperkalemia: Secondary | ICD-10-CM

## 2021-10-15 LAB — COMPREHENSIVE METABOLIC PANEL
ALT: 10 IU/L (ref 0–32)
AST: 19 IU/L (ref 0–40)
Albumin/Globulin Ratio: 1.7 (ref 1.2–2.2)
Albumin: 4.8 g/dL — ABNORMAL HIGH (ref 3.7–4.7)
Alkaline Phosphatase: 61 IU/L (ref 44–121)
BUN/Creatinine Ratio: 20 (ref 12–28)
BUN: 18 mg/dL (ref 8–27)
Bilirubin Total: 0.4 mg/dL (ref 0.0–1.2)
CO2: 22 mmol/L (ref 20–29)
Calcium: 10.3 mg/dL (ref 8.7–10.3)
Chloride: 100 mmol/L (ref 96–106)
Creatinine, Ser: 0.9 mg/dL (ref 0.57–1.00)
Globulin, Total: 2.9 g/dL (ref 1.5–4.5)
Glucose: 125 mg/dL — ABNORMAL HIGH (ref 70–99)
Potassium: 6.3 mmol/L — ABNORMAL HIGH (ref 3.5–5.2)
Sodium: 140 mmol/L (ref 134–144)
Total Protein: 7.7 g/dL (ref 6.0–8.5)
eGFR: 68 mL/min/{1.73_m2} (ref >59)

## 2021-10-15 LAB — CBC WITH DIFFERENTIAL/PLATELET
Basophils Absolute: 0 10*3/uL (ref 0.0–0.2)
Basos: 0 %
EOS (ABSOLUTE): 0.2 10*3/uL (ref 0.0–0.4)
Eos: 4 %
Hematocrit: 42.3 % (ref 34.0–46.6)
Hemoglobin: 14.2 g/dL (ref 11.1–15.9)
Immature Grans (Abs): 0 10*3/uL (ref 0.0–0.1)
Immature Granulocytes: 1 %
Lymphocytes Absolute: 2.2 10*3/uL (ref 0.7–3.1)
Lymphs: 32 %
MCH: 31.1 pg (ref 26.6–33.0)
MCHC: 33.6 g/dL (ref 31.5–35.7)
MCV: 93 fL (ref 79–97)
Monocytes Absolute: 0.7 10*3/uL (ref 0.1–0.9)
Monocytes: 11 %
Neutrophils Absolute: 3.6 10*3/uL (ref 1.4–7.0)
Neutrophils: 52 %
Platelets: 377 10*3/uL (ref 150–450)
RBC: 4.57 x10E6/uL (ref 3.77–5.28)
RDW: 12 % (ref 11.7–15.4)
WBC: 6.8 10*3/uL (ref 3.4–10.8)

## 2021-10-15 LAB — POTASSIUM: Potassium: 5.2 mmol/L (ref 3.5–5.2)

## 2021-10-15 NOTE — Progress Notes (Signed)
Potassium was very high.  Unsure if she was a hard stick (can check with Verdis Frederickson), as she was very dehydrated.  Needs to be rechecked today. I'm entering stat order.  Please contact pt to have her come in for nonfasting lab today.

## 2021-10-15 NOTE — Addendum Note (Signed)
Addended by: Rita Ohara on: 10/15/2021 09:18 AM   Modules accepted: Orders

## 2021-10-25 DIAGNOSIS — J301 Allergic rhinitis due to pollen: Secondary | ICD-10-CM | POA: Diagnosis not present

## 2021-10-25 DIAGNOSIS — J4540 Moderate persistent asthma, uncomplicated: Secondary | ICD-10-CM | POA: Diagnosis not present

## 2021-10-25 DIAGNOSIS — J3081 Allergic rhinitis due to animal (cat) (dog) hair and dander: Secondary | ICD-10-CM | POA: Diagnosis not present

## 2021-10-25 DIAGNOSIS — J3089 Other allergic rhinitis: Secondary | ICD-10-CM | POA: Diagnosis not present

## 2021-11-27 ENCOUNTER — Other Ambulatory Visit: Payer: Self-pay

## 2021-11-27 ENCOUNTER — Encounter: Payer: Self-pay | Admitting: Family Medicine

## 2021-11-27 ENCOUNTER — Other Ambulatory Visit: Payer: Self-pay | Admitting: *Deleted

## 2021-11-27 ENCOUNTER — Telehealth (INDEPENDENT_AMBULATORY_CARE_PROVIDER_SITE_OTHER): Payer: PPO | Admitting: Family Medicine

## 2021-11-27 ENCOUNTER — Other Ambulatory Visit (INDEPENDENT_AMBULATORY_CARE_PROVIDER_SITE_OTHER): Payer: PPO

## 2021-11-27 VITALS — Temp 94.5°F | Ht 63.5 in | Wt 113.0 lb

## 2021-11-27 DIAGNOSIS — R0989 Other specified symptoms and signs involving the circulatory and respiratory systems: Secondary | ICD-10-CM

## 2021-11-27 DIAGNOSIS — R051 Acute cough: Secondary | ICD-10-CM

## 2021-11-27 DIAGNOSIS — R059 Cough, unspecified: Secondary | ICD-10-CM

## 2021-11-27 DIAGNOSIS — J029 Acute pharyngitis, unspecified: Secondary | ICD-10-CM

## 2021-11-27 LAB — POCT INFLUENZA A/B
Influenza A, POC: NEGATIVE
Influenza B, POC: NEGATIVE

## 2021-11-27 LAB — POC COVID19 BINAXNOW: SARS Coronavirus 2 Ag: NEGATIVE

## 2021-11-27 NOTE — Progress Notes (Signed)
Start time: 2:01 End time: 2:28  Virtual Visit via Video Note  I connected with Driscilla Grammes on 11/27/21 by a video enabled telemedicine application and verified that I am speaking with the correct person using two identifiers.  Location: Patient: home Provider: office   I discussed the limitations of evaluation and management by telemedicine and the availability of in person appointments. The patient expressed understanding and agreed to proceed.  History of Present Illness:  Chief Complaint  Patient presents with   Sore Throat    VIRTUAL sore throat, headache, runny nose and cough. ST started 3 days ago and cough started yesterday. Has been taking some old tessalon perles that you had rx'd for her earlier this year-has helped. No home covid tests.    3 days ago she started with a sore throat.  Yesterday afternoon she started coughing. She used honey without benefit.  Last night she found leftover tessalon perles, took one last night and one today. This helps for the cough. Sore throat and runny nose persists. Today her eyes are watering more. This usually occurs when she is "sick", not with allergies. Feels different than allergies. Cough is starting to settle into her chest, notes some burning in chest with coughing. Nasal drainage is clear. Cough is productive of white phlegm, but is getting thicker. Denies any shortness of breath, just heavy sensation. Hasn't needed any albuterol.  Doesn't feel like asthma, so hasn't started her Symbicort yet (uses just prn). This morning felt like ears are impacted some by the illness--sensitive, not painful.  Feels different. No plugging, popping, decreased hearing or pain.  No known sick contacts. +travel for Christmas Jilda Roche have been sick with COVID in the last couple of weeks (denies direct contact).  PMH, PSH, SH reviewed  COVID vaccines x 3 (no bivalent booster).  Outpatient Encounter Medications as of 11/27/2021   Medication Sig Note   benzonatate (TESSALON) 200 MG capsule Take 200 mg by mouth 3 (three) times daily as needed for cough. 11/27/2021: Last dose 11:00am   gabapentin (NEURONTIN) 100 MG capsule Take 2 capsules (200 mg total) by mouth at bedtime. (Patient taking differently: Take 100 mg by mouth daily.)    Omega-3 Fatty Acids (FISH OIL) 1000 MG CAPS Take by mouth.    sertraline (ZOLOFT) 100 MG tablet Take 100 mg by mouth daily.    albuterol (VENTOLIN HFA) 108 (90 Base) MCG/ACT inhaler Inhale 2 puffs into the lungs every 6 (six) hours as needed for wheezing or shortness of breath. (Patient not taking: Reported on 11/27/2021) 10/14/2021: Uses prn   budesonide-formoterol (SYMBICORT) 80-4.5 MCG/ACT inhaler Inhale 2 puffs into the lungs 2 (two) times daily as needed (asthma). (Patient not taking: Reported on 11/27/2021)    fluorometholone (FML) 0.1 % ophthalmic suspension SMARTSIG:In Eye(s) (Patient not taking: Reported on 10/09/2021) 10/09/2021: Uses as needed   levocetirizine (XYZAL) 5 MG tablet Take 5 mg by mouth daily. (Patient not taking: Reported on 11/27/2021) 11/27/2021: Uses as needed   [DISCONTINUED] Hyoscyamine Sulfate SL (LEVSIN/SL) 0.125 MG SUBL Place 1 tablet under the tongue every 4 (four) hours as needed (abdominal cramping).    [DISCONTINUED] ondansetron (ZOFRAN-ODT) 4 MG disintegrating tablet Take 1 tablet (4 mg total) by mouth every 8 (eight) hours as needed for nausea or vomiting.    No facility-administered encounter medications on file as of 11/27/2021.   Allergies  Allergen Reactions   Bee Venom Anaphylaxis and Swelling   Shellfish Allergy Anaphylaxis   Codeine Nausea And Vomiting  Sulfonamide Derivatives Nausea And Vomiting   Tetracycline     Unsure about this med 08/28/15   ROS: no fever, chills.  URI symptoms per HPI (ST, cough, runny nose). No n/v/d, rash, wheezing or chest pain. Some chest heaviness. See HPI    Observations/Objective:  Temp (!) 94.5 F (34.7 C)  (Tympanic)    Ht 5' 3.5" (1.613 m)    Wt 113 lb (51.3 kg)    BMI 19.70 kg/m   Sounds somewhat nasal. No throat clearing or coughing during visit. She is alert and oriented. She is speaking comfortably. Cranial nerves are grossly intact. Exam is limited due to the virtual nature of the visit.  Rapid influenza A&B negative Rapid COVID negative. PCR sent   Assessment and Plan:  Symptoms of upper respiratory infection (URI) - supportive measures reviewed.  Negative flu and rapid COVID test, PCR pending. Consider allergies also contributing.  Acute cough  Stay well hydrated. Mucinex DM 12 hour twice daily (expectorant to thin out the mucus and a cough suppressant). You may use Sudafed if needed to help dry up the runny nose and for any sinus pain. Allergies may be contributing, so consider also taking xyzal. Nasal saline can help with the dry nose. Consider restarting the Symbicort if any increase in chest tightness or wheezing.  Your flu tests were negative. Your rapid test for COVID was negative.  The PCR should come back tomorrow. If + for COVID, we will send in a prescription for Paxlovid due to your risks of asthma and age >86. Please contact us if symptoms persist or worsen.    Follow Up Instructions:    I discussed the assessment and treatment plan with the patient. The patient was provided an opportunity to ask questions and all were answered. The patient agreed with the plan and demonstrated an understanding of the instructions.   The patient was advised to call back or seek an in-person evaluation if the symptoms worsen or if the condition fails to improve as anticipated.  I spent 31 minutes dedicated to the care of this patient, including pre-visit review of records, face to face time, post-visit ordering of testing and documentation.    Vikki Ports, MD

## 2021-11-27 NOTE — Patient Instructions (Addendum)
Stay well hydrated. Mucinex DM 12 hour twice daily (expectorant to thin out the mucus and a cough suppressant). You may use Sudafed if needed to help dry up the runny nose and for any sinus pain. Allergies may be contributing, so consider also taking xyzal. Nasal saline can help with the dry nose. Consider restarting the Symbicort if any increase in chest tightness or wheezing.  Your flu tests were negative. Your rapid test for COVID was negative.  The PCR should come back tomorrow. If + for COVID, we will send in a prescription for Paxlovid due to your risks of asthma and age >47. Please contact us if symptoms persist or worsen.

## 2021-11-28 ENCOUNTER — Encounter: Payer: Self-pay | Admitting: Family Medicine

## 2021-11-28 LAB — NOVEL CORONAVIRUS, NAA: SARS-CoV-2, NAA: NOT DETECTED

## 2021-11-28 LAB — SARS-COV-2, NAA 2 DAY TAT

## 2021-12-04 NOTE — Progress Notes (Signed)
[facility] Phone: [phone] Subjective:   Deborah Bray, am serving as a scribe for Dr. Hulan Saas. This visit occurred during the SARS-CoV-2 public health emergency.  Safety protocols were in place, including screening questions prior to the visit, additional usage of staff PPE, and extensive cleaning of exam room while observing appropriate contact time as indicated for disinfecting solutions.   I'm seeing this patient by the request  of:  Irene Pap, PA-C  CC: Left wrist pain  [id number]  04/2021 Patient is having hip pain and I am more concerned that seems to be secondary to back.  Patient has signs and symptoms consistent with L3-L4 and L4-L5 spinal stenosis.  Patient states that the pain is worse with standing and better with activity.  Patient on quick ultrasound that was unable to be saved shows some very mild intersubstance tearing of the gluteal tendon but will hold on treatment for this.  Do feel advanced imaging with patient having weakness of the hip girdle on the right side for the lumbar spine would be beneficial.  Depending on findings I am expecting some adjacent segment disease and spinal stenosis and see if patient could potentially respond well to other epidurals.  Patient also will be started on gabapentin and given some home exercises.  Follow-up with me again 4 to 6 weeks.  Updated 12/05/2021 Deborah Bray is a 71 y.o. female coming in with complaint of left hand weakness. Weakness has been weak for a couple of weeks. Felt pressure around arm made sensation in fingers more intense. Thumb and pointer weak, other 3 fingers cold and tingling. Shoulder fine. Never gone numb. Has tried stretching and hot water, but no change.  Denies any chest pain or any association with activity specifically.       Past Medical History:  Diagnosis Date   Asthma    allergy induced   CIN I  (cervical intraepithelial neoplasia I)    CIN III (cervical intraepithelial neoplasia III)    Depression    Elevated cholesterol    Encounter for screening for lung cancer 12/08/2017   HEADACHE 03/07/2008   Qualifier: Diagnosis of  By: Sherlynn Stalls, CMA, Cindy     Nasal fracture    Osteoporosis    Past Surgical History:  Procedure Laterality Date   BACK SURGERY     CERVICAL BIOPSY  W/ LOOP ELECTRODE EXCISION     CLOSED REDUCTION NASAL FRACTURE N/A 09/19/2016   Procedure: CLOSED REDUCTION NASAL FRACTURE;  Surgeon: Rozetta Nunnery, MD;  Location: [facility];  Service: ENT;  Laterality: N/A;   COLPOSCOPY     GYNECOLOGIC CRYOSURGERY     KNEE SURGERY     Arthroscopic X 3   NOSE SURGERY     Squamous cell excised     Lip   TURBINATE REDUCTION Bilateral 09/19/2016   Procedure: TURBINATE REDUCTION;  Surgeon: Rozetta Nunnery, MD;  Location: [facility];  Service: ENT;  Laterality: Bilateral;   vocal cord surg     Social History   Socioeconomic History   Marital status: Single    Spouse name: Not on file   Number of children: Not on file   Years of education: Not on file   Highest education level: Not on file  Occupational History   Not on file  Tobacco Use   Smoking status: Former    Packs/day: 4.00    Years:  15.00    Pack years: 60.00    Types: Cigarettes    Quit date: 08/28/1999    Years since quitting: 22.2   Smokeless tobacco: Never  Vaping Use   Vaping Use: Never used  Substance and Sexual Activity   Alcohol use: Yes    Alcohol/week: 14.0 standard drinks    Types: 14 Glasses of wine per week    Comment: 2 glasses of wine daily   Drug use: Not Currently    Comment: in college- marijuana, mushrooms    Sexual activity: Not Currently    Birth control/protection: Post-menopausal    Comment: 1st intercourse 71 yo-More than 5 partners  Other Topics Concern   Not on file  Social History Narrative   Not on file   Social Determinants of  Health   Financial Resource Strain: Not on file  Food Insecurity: Not on file  Transportation Needs: Not on file  Physical Activity: Not on file  Stress: Not on file  Social Connections: Not on file   Allergies  Allergen Reactions   Bee Venom Anaphylaxis and Swelling   Shellfish Allergy Anaphylaxis   Codeine Nausea And Vomiting   Sulfonamide Derivatives Nausea And Vomiting   Tetracycline     Unsure about this med 08/28/15   Family History  Problem Relation Age of Onset   Hypertension Mother    Diabetes Mother    Allergies Mother    Bowel Disease Mother    Glaucoma Mother    Heart attack Mother 77   Other Sister        Brain tumor-benign   Cancer Sister        BRAIN TUMOR   Cancer Brother        Throat cancer   Parkinson's disease Father       Current Outpatient Medications (Respiratory):    albuterol (VENTOLIN HFA) 108 (90 Base) MCG/ACT inhaler, Inhale 2 puffs into the lungs every 6 (six) hours as needed for wheezing or shortness of breath. (Patient not taking: Reported on 11/27/2021)   benzonatate (TESSALON) 200 MG capsule, Take 200 mg by mouth 3 (three) times daily as needed for cough.   budesonide-formoterol (SYMBICORT) 80-4.5 MCG/ACT inhaler, Inhale 2 puffs into the lungs 2 (two) times daily as needed (asthma). (Patient not taking: Reported on 11/27/2021)   levocetirizine (XYZAL) 5 MG tablet, Take 5 mg by mouth daily. (Patient not taking: Reported on 11/27/2021)    Current Outpatient Medications (Other):    fluorometholone (FML) 0.1 % ophthalmic suspension, SMARTSIG:In Eye(s) (Patient not taking: Reported on 10/09/2021)   gabapentin (NEURONTIN) 100 MG capsule, Take 2 capsules (200 mg total) by mouth at bedtime. (Patient taking differently: Take 100 mg by mouth daily.)   Omega-3 Fatty Acids (FISH OIL) 1000 MG CAPS, Take by mouth.   sertraline (ZOLOFT) 100 MG tablet, Take 100 mg by mouth daily.     Objective  Blood pressure 126/90, pulse 80, height 5\' 3"  (1.6 m),  weight 116 lb (52.6 kg), SpO2 96 %.   General: No apparent distress alert and oriented x3 mood and affect normal, dressed appropriately.  HEENT: Pupils equal, extraocular movements intact  Respiratory: Patient's speak in full sentences and does not appear short of breath  Cardiovascular: No lower extremity edema, non tender, no erythema  Gait normal with good balance and coordination.  MSK: Arthritic changes of multiple joints.  Patient does note hand does not have any true atrophy noted.  Good grip strength noted.  Patient has a negative Spurling's.  Negative Tinel's but minorly positive Phalen's.  Limited muscular skeletal ultrasound was performed and interpreted by Hulan Saas, M  Limited ultrasound shows the patient does have a bifid median nerve at the wrist.  Patient does have thickening noted of the retinaculum.  Mild dilation of the nerve in total. 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    Impression and Recommendations:    The above documentation has been reviewed and is accurate and complete Lyndal Pulley, DO

## 2021-12-05 ENCOUNTER — Other Ambulatory Visit: Payer: Self-pay

## 2021-12-05 ENCOUNTER — Ambulatory Visit: Payer: Self-pay

## 2021-12-05 ENCOUNTER — Encounter: Payer: Self-pay | Admitting: Family Medicine

## 2021-12-05 ENCOUNTER — Ambulatory Visit: Payer: PPO | Admitting: Family Medicine

## 2021-12-05 VITALS — BP 126/90 | HR 80 | Ht 63.0 in | Wt 116.0 lb

## 2021-12-05 DIAGNOSIS — G5602 Carpal tunnel syndrome, left upper limb: Secondary | ICD-10-CM | POA: Diagnosis not present

## 2021-12-05 DIAGNOSIS — M79642 Pain in left hand: Secondary | ICD-10-CM

## 2021-12-05 NOTE — Assessment & Plan Note (Signed)
Patient on ultrasound does have what appears to be more than body fluid noted.  We discussed with patient about different treatment options.  We discussed potentially trying a injection but at this moment I think bracing and exercises will do relatively well.  Continue on the gabapentin that is helped her back up significantly.  Follow-up again in 6 to 8 weeks if not improved consider formal occupational therapy or injection

## 2021-12-05 NOTE — Patient Instructions (Signed)
Do prescribed exercises at least 3x a week Brace day and night for 2 weeks, then at night for 2 weeks Continue Gabapentin  See you again in 4-6 weeks

## 2021-12-22 NOTE — Progress Notes (Signed)
Chief Complaint  Patient presents with   Cough    Seen virtually 11/27/21. Still having cough and ST. Nasal passages get so dry at night. Having to take a tessalon every night at 4am.    She had virtual visit 11/27/21 with sore throat and cough. At that time, cough was starting to settle into her chest, notes some burning in chest with coughing. Nasal drainage was clear, cough was productive of white phlegm, thick.  There was no SOB, hadn't needed albuterol, didn't feel like asthma. The day of visit she also had sensitive ears (not plugging, popping or painful). The following day her throat was worse, cough more intense. Mucinex helped with chest congestion, but needed benzonatate for cough. She did need to use symbicort.  She tested negative for COVID and flu at 1/11 visit.  The cough is no longer productive--triggered by a tickle at her throat.  Once it starts, it won't go away.  Tessalon is the only thing that helps. She had leftover from an illness last year, and is requesting a refill.  She tried mucinex 12 hour x 2 doses, didn't notice much of a difference. Nasal passages feel dry at night, sore throat gets worse at night. In the mornings she notes nasal congestion, and aware of some PND. Nasal drainage is clear.  Hasn't been taking anything for allergies recently.  Has Xyzal to use when needed (needs it year-round, fluctuates throughout the year).  She is using the symbicort just as needed, needed it yesterday and today, but not daily. Symbicort seems to help, so hasn't tried using any albuterol.   PMH, PSH, SH reviewed  Outpatient Encounter Medications as of 12/23/2021  Medication Sig Note   benzonatate (TESSALON) 200 MG capsule Take 200 mg by mouth 3 (three) times daily as needed for cough. 12/23/2021: Last dose 4am   budesonide-formoterol (SYMBICORT) 80-4.5 MCG/ACT inhaler Inhale 2 puffs into the lungs 2 (two) times daily as needed (asthma).    Cholecalciferol (VITAMIN D) 50 MCG (2000  UT) tablet Take 2,000 Units by mouth daily.    gabapentin (NEURONTIN) 100 MG capsule Take 2 capsules (200 mg total) by mouth at bedtime. (Patient taking differently: Take 100 mg by mouth daily.)    Omega-3 Fatty Acids (FISH OIL) 1000 MG CAPS Take by mouth.    sertraline (ZOLOFT) 100 MG tablet Take 100 mg by mouth daily.    albuterol (VENTOLIN HFA) 108 (90 Base) MCG/ACT inhaler Inhale 2 puffs into the lungs every 6 (six) hours as needed for wheezing or shortness of breath. (Patient not taking: Reported on 11/27/2021) 10/14/2021: Uses prn   fluorometholone (FML) 0.1 % ophthalmic suspension SMARTSIG:In Eye(s) (Patient not taking: Reported on 10/09/2021) 10/09/2021: Uses as needed   [DISCONTINUED] levocetirizine (XYZAL) 5 MG tablet Take 5 mg by mouth daily. (Patient not taking: Reported on 11/27/2021) 11/27/2021: Uses as needed   No facility-administered encounter medications on file as of 12/23/2021.   Allergies  Allergen Reactions   Bee Venom Anaphylaxis and Swelling   Shellfish Allergy Anaphylaxis   Codeine Nausea And Vomiting   Sulfonamide Derivatives Nausea And Vomiting   Tetracycline     Unsure about this med 08/28/15    ROS: Denies fever, chills, headaches, dizziness, chest pain.  Denies nausea, vomiting, bowel changes, urinary complaints, bleeding, rash. Some easy bruising. Cough, congestion and sore throat per HPI.    PHYSICAL EXAM:  BP 130/78    Pulse 76    Temp 98.3 F (36.8 C) (Tympanic)  Ht 5\' 3"  (1.6 m)    Wt 117 lb 3.2 oz (53.2 kg)    BMI 20.76 kg/m   Well-appearing female, who sounds congested.  She is in no distress. No throat-clearing or coughing during visit. HEENT: conjunctiva and sclera are clear, EOMI TM's and EAC's normal Nasal mucosa is moderately edematous, pale, with some clear mucus.  OP is clear (no cobblestoning or erythema noted).  Sinuses nontender Neck: no lymphadenopathy or mass Heart: regular rate and rhythm Lungs: clear bilaterally, no wheezing. Skin:  normal turgor, no rash Psych: normal mood, affect, hygiene and grooming Neuro: alert and oriented, normal gait, cranial nerves grossly intact.   ASSESSMENT/PLAN:  Allergic rhinitis, unspecified seasonality, unspecified trigger - Plan: levocetirizine (XYZAL) 5 MG tablet  Cough, unspecified type - Plan: benzonatate (TESSALON) 200 MG capsule  I suspect, based on today's exam, that allergies and postnasal drainage are causing your symptoms. Drink plenty of water. Take Mucinex 12 hour twice daily (plain or DM, your choice; if using plain, you can use a Delsym syrup at bedtime if needed--this is the 12 hour DM portion, cough suppressant). Start taking Xyzal every day. If you continue to have nasal congestion and postnasal drainage after a week of doing these measures, consider adding a nasal steroid spray such as Flonase (gentle sniffs into each nostril).   Seek re-evaluation if you develop fever, discolored mucus, or other worsening or new symptoms.

## 2021-12-23 ENCOUNTER — Encounter: Payer: Self-pay | Admitting: Family Medicine

## 2021-12-23 ENCOUNTER — Ambulatory Visit (INDEPENDENT_AMBULATORY_CARE_PROVIDER_SITE_OTHER): Payer: PPO | Admitting: Family Medicine

## 2021-12-23 ENCOUNTER — Other Ambulatory Visit: Payer: Self-pay

## 2021-12-23 VITALS — BP 130/78 | HR 76 | Temp 98.3°F | Ht 63.0 in | Wt 117.2 lb

## 2021-12-23 DIAGNOSIS — J309 Allergic rhinitis, unspecified: Secondary | ICD-10-CM | POA: Diagnosis not present

## 2021-12-23 DIAGNOSIS — R059 Cough, unspecified: Secondary | ICD-10-CM

## 2021-12-23 MED ORDER — LEVOCETIRIZINE DIHYDROCHLORIDE 5 MG PO TABS: 5.0000 mg | ORAL_TABLET | Freq: Every evening | ORAL | 5 refills | Status: DC

## 2021-12-23 MED ORDER — BENZONATATE 200 MG PO CAPS: 200.0000 mg | ORAL_CAPSULE | Freq: Three times a day (TID) | ORAL | 0 refills | Status: DC | PRN

## 2021-12-23 NOTE — Patient Instructions (Signed)
I suspect, based on today's exam, that allergies and postnasal drainage are causing your symptoms. Drink plenty of water. Take Mucinex 12 hour twice daily (plain or DM, your choice; if using plain, you can use a Delsym syrup at bedtime if needed--this is the 12 hour DM portion, cough suppressant). Start taking Xyzal every day. If you continue to have nasal congestion and postnasal drainage after a week of doing these measures, consider adding a nasal steroid spray such as Flonase (gentle sniffs into each nostril).  Seek re-evaluation if you develop fever, discolored mucus, or other worsening or new symptoms.

## 2021-12-26 DIAGNOSIS — F411 Generalized anxiety disorder: Secondary | ICD-10-CM | POA: Diagnosis not present

## 2022-01-03 ENCOUNTER — Telehealth: Payer: Self-pay | Admitting: Physician Assistant

## 2022-01-03 NOTE — Telephone Encounter (Signed)
Left message for patient to call back and schedule Medicare Annual Wellness Visit (AWV) either virtually or in office. I left my number for patient to call [phone].  Last AWV ;04/30/22  please schedule at anytime with health coach  This should be a 45 minute visit.   Awv can be scheduled calendar year healthteam

## 2022-02-04 ENCOUNTER — Other Ambulatory Visit: Payer: Self-pay | Admitting: Dermatology

## 2022-02-04 DIAGNOSIS — D485 Neoplasm of uncertain behavior of skin: Secondary | ICD-10-CM | POA: Diagnosis not present

## 2022-02-04 DIAGNOSIS — Z23 Encounter for immunization: Secondary | ICD-10-CM | POA: Diagnosis not present

## 2022-02-04 DIAGNOSIS — C44729 Squamous cell carcinoma of skin of left lower limb, including hip: Secondary | ICD-10-CM | POA: Diagnosis not present

## 2022-02-05 LAB — DERMPATH, SPECIMEN A

## 2022-02-05 LAB — DERMATOPATHOLOGY REPORT

## 2022-03-11 ENCOUNTER — Telehealth: Payer: Self-pay | Admitting: Physician Assistant

## 2022-03-11 NOTE — Telephone Encounter (Signed)
Left message for pt to call concerning Prolia. Pt portion is $301 and good to schedule at anytime and medication needs to be orders.  ?

## 2022-03-13 ENCOUNTER — Other Ambulatory Visit: Payer: Self-pay | Admitting: Dermatology

## 2022-03-13 DIAGNOSIS — L905 Scar conditions and fibrosis of skin: Secondary | ICD-10-CM | POA: Diagnosis not present

## 2022-03-13 DIAGNOSIS — C44729 Squamous cell carcinoma of skin of left lower limb, including hip: Secondary | ICD-10-CM | POA: Diagnosis not present

## 2022-03-14 LAB — DERMPATH, SPECIMEN A

## 2022-03-14 LAB — DERMATOPATHOLOGY REPORT

## 2022-03-17 NOTE — Telephone Encounter (Signed)
Left another message for pt to call concerning prolia.  ?

## 2022-03-26 ENCOUNTER — Telehealth: Payer: Self-pay | Admitting: Physician Assistant

## 2022-03-26 ENCOUNTER — Encounter: Payer: Self-pay | Admitting: Physician Assistant

## 2022-03-26 NOTE — Telephone Encounter (Signed)
Left another message for pt to call back concerning Prolia. ?

## 2022-04-16 ENCOUNTER — Telehealth: Payer: Self-pay | Admitting: Physician Assistant

## 2022-04-16 NOTE — Telephone Encounter (Signed)
Left message for patient to call back and schedule Medicare Annual Wellness Visit (AWV) either virtually or in office. I left my number for patient to call [phone].  Last AWV 04/30/21  please schedule at anytime with health coach   

## 2022-05-07 ENCOUNTER — Ambulatory Visit: Payer: PPO | Admitting: Physician Assistant

## 2022-05-07 NOTE — Progress Notes (Deleted)
Deborah Bray is a 71 y.o. female who presents for annual wellness visit and follow-up on chronic medical conditions.    She has the following concerns:  Reports she is generally feeling well, fairly well, poorly*; is eating a *** diet; is sleeping well ***; drinks *** bottles of water a day; is exercising *  Immunizations and Health Maintenance Immunization History  Administered Date(s) Administered   Hepatitis A 08/05/1999, 02/07/2000   Hepatitis B 08/05/1999, 09/06/1999, 02/07/2000   Influenza Whole 08/22/2010   Influenza, High Dose Seasonal PF 08/28/2015, 08/31/2018   Influenza,inj,Quad PF,6+ Mos 09/11/2014   Moderna Sars-Covid-2 Vaccination 01/19/2020, 02/15/2020, 07/13/2020   OPV 08/05/1999   Pneumococcal Conjugate-13 08/28/2015   Pneumococcal Polysaccharide-23 01/23/2017   Td 11/17/2000   Health Maintenance  Topic Date Due   Zoster Vaccines- Shingrix (1 of 2) Never done   TETANUS/TDAP  11/17/2010   COVID-19 Vaccine (4 - Booster for Moderna series) 09/07/2020   INFLUENZA VACCINE  06/17/2022   Pneumonia Vaccine 55+ Years old  Completed   DEXA SCAN  Completed   Hepatitis C Screening  Completed   HPV VACCINES  Aged Out   MAMMOGRAM  Discontinued   COLONOSCOPY (Pts 45-84yr Insurance coverage will need to be confirmed)  Discontinued     The ASCVD Risk score (Arnett DK, et al., 2019) failed to calculate for the following reasons:   The valid HDL cholesterol range is 20 to 100 mg/dL  Dentist: Ophtho: Exercise:  Patient Care Team: WMarcellina Millinas PCP - General (Physician Assistant)   Advanced directives:    Depression screen:  See questionnaire below.     12/23/2021   11:17 AM 04/30/2021    9:11 AM 03/07/2021    4:19 PM 04/02/2020    8:33 AM 08/30/2018    2:19 PM  Depression screen PHQ 2/9  Decreased Interest 0 0 0 0 0  Down, Depressed, Hopeless 0 0 0 0 0  PHQ - 2 Score 0 0 0 0 0  Altered sleeping  0     Tired, decreased energy  0     Change  in appetite  0     Feeling bad or failure about yourself   0     Trouble concentrating  0     Moving slowly or fidgety/restless  0     Suicidal thoughts  0     PHQ-9 Score  0     Difficult doing work/chores  Not difficult at all       Fall Risk Screen: see questionnaire below.    04/30/2021    9:11 AM 03/07/2021    4:19 PM 04/02/2020    8:33 AM 08/30/2018    2:19 PM 01/23/2017    9:54 AM  Fall Risk   Falls in the past year? 0 0 1 Yes No  Number falls in past yr: 0 0 1 1   Injury with Fall? 0 0 0 Yes   Risk for fall due to : No Fall Risks No Fall Risks  Other (Comment)   Follow up Falls evaluation completed Falls evaluation completed       ADL screen:  See questionnaire below Functional Status Survey:     Review of Systems  There were no vitals taken for this visit.  Physical Exam  ASSESSMENT/PLAN  Problem List Items Addressed This Visit   None   Discussed monthly self breast exams and yearly mammograms; at least 30 minutes of aerobic activity at least 5 days/week  and weight-bearing exercise 2x/week; proper sunscreen use reviewed; healthy diet, including goals of calcium and vitamin D intake and alcohol recommendations (less than or equal to 1 drink/day) reviewed; regular seatbelt use; changing batteries in smoke detectors.  Immunization recommendations discussed.  Colonoscopy recommendations reviewed   Medicare Attestation I have personally reviewed: The patient's medical and social history Their use of alcohol, tobacco or illicit drugs Their current medications and supplements The patient's functional ability including ADLs,fall risks, home safety risks, cognitive, and hearing and visual impairment Diet and physical activities Evidence for depression or mood disorders  The patient's weight, height, and BMI have been recorded in the chart.  I have made referrals, counseling, and provided education to the patient based on review of the above and I have provided the  patient with a written personalized care plan for preventive services.    No follow-ups on file.    Irene Pap, PA-C   05/07/2022

## 2022-05-09 ENCOUNTER — Telehealth: Payer: Self-pay | Admitting: Physician Assistant

## 2022-05-09 NOTE — Telephone Encounter (Signed)
Left message for pt to return call. Pt informed that we need to reschedule her Prolia injection ASAP. Medication has already been ordered and is fridge.

## 2022-05-12 ENCOUNTER — Other Ambulatory Visit: Payer: PPO

## 2022-05-12 DIAGNOSIS — M810 Age-related osteoporosis without current pathological fracture: Secondary | ICD-10-CM | POA: Diagnosis not present

## 2022-05-12 MED ORDER — DENOSUMAB 60 MG/ML ~~LOC~~ SOSY
60.0000 mg | PREFILLED_SYRINGE | Freq: Once | SUBCUTANEOUS | Status: AC
  Administered 2022-05-12: 60 mg via SUBCUTANEOUS

## 2022-05-13 ENCOUNTER — Telehealth: Payer: Self-pay | Admitting: Physician Assistant

## 2022-05-16 ENCOUNTER — Encounter: Payer: Self-pay | Admitting: Internal Medicine

## 2022-05-21 ENCOUNTER — Telehealth: Payer: Self-pay | Admitting: *Deleted

## 2022-05-21 NOTE — Telephone Encounter (Signed)
Pt called stating she needs a refill of gabapentin '100mg'$ . She picked up the rx that was sent in on 6/9 but she's been taking 2 a day & also giving her dog 2 a day as well & now she is out of the gabapentin. I advised pt Dr. Tamala Julian is out of the office this week & would need to get his approval on sending in a new rx.

## 2022-05-22 NOTE — Telephone Encounter (Signed)
We can fill for 2 a day for her but I am unable to prescribe for her dog

## 2022-05-23 MED ORDER — GABAPENTIN 100 MG PO CAPS: 200.0000 mg | ORAL_CAPSULE | Freq: Every day | ORAL | 0 refills | Status: DC

## 2022-05-23 NOTE — Telephone Encounter (Signed)
Discussed with pt.  Refill sent to pharmacy.

## 2022-06-09 ENCOUNTER — Telehealth: Payer: Self-pay | Admitting: Physician Assistant

## 2022-06-09 NOTE — Telephone Encounter (Signed)
Left message for patient to call back and schedule Medicare Annual Wellness Visit (AWV) either virtually or in office. I left my number for patient to call [phone].  Last AWV ;04/30/21  please schedule at anytime with health coach

## 2022-06-12 ENCOUNTER — Ambulatory Visit (INDEPENDENT_AMBULATORY_CARE_PROVIDER_SITE_OTHER): Payer: PPO | Admitting: Family Medicine

## 2022-06-12 ENCOUNTER — Encounter: Payer: Self-pay | Admitting: Family Medicine

## 2022-06-12 VITALS — BP 124/76 | HR 72 | Temp 97.6°F | Ht 63.0 in | Wt 111.6 lb

## 2022-06-12 DIAGNOSIS — K5900 Constipation, unspecified: Secondary | ICD-10-CM | POA: Diagnosis not present

## 2022-06-12 DIAGNOSIS — M810 Age-related osteoporosis without current pathological fracture: Secondary | ICD-10-CM

## 2022-06-12 DIAGNOSIS — K5790 Diverticulosis of intestine, part unspecified, without perforation or abscess without bleeding: Secondary | ICD-10-CM | POA: Diagnosis not present

## 2022-06-12 NOTE — Patient Instructions (Addendum)
Continue to drink plenty of water (so that your urine is very pale or clear).  You want to follow a high fiber diet.  If you can't get enough fiber in your diet, consider a supplement such as Metamucil, Citricel, Benefiber, or others. I prefer the packets/powder, as that ensures that you drink enough water with it.  If you take fiber (wafer or capsule) without enough water, it might make you feel more bloated.  Your coffee may have been controlling your bowels.  Restart your morning coffee routine, and see if this helps. You can also take Colace daily (stool softener). If you don't get results with these measures, then start miralax. Take Miralax 1 capful daily until you get results. You can then back off on the dose or frequency until you get the desired effect (take less if your stools are too loose or too frequent; either 1/2 capful daily, or a full capful a few times/week is usually good for most people).  If your constipation persists, or if you develop other thyroid symptoms (weight gain, worsening fatigue, hair thinning/loss, etc), then we should recheck the thyroid.  Be sure to get 1200-'1500mg'$  of calcium daily, along with 1000 IU of vitamin D3 in order for your Prolia to do its full job and building your bones.

## 2022-06-12 NOTE — Progress Notes (Signed)
Chief Complaint  Patient presents with   Abdominal Pain    Started about a month ago with general nausea. She stopped drinking so much coffee. Started eating more fiber and drinking more water. Feels very bloated. Not having many BM's, not even once a day-and small little pellets or loose. Feels very backed up. No vomiting.    About a month ago (around the same time as stopping gabapentin, or a little after) she noticed more queasy stomach.  She stopped drinking coffee, but that didn't help.  She wasn't having bowel movements. She increased "roughage", veggies.  She then developed pain across her lower abdomen (across entire lower abdomen, bilaterally). She was having cramps.  Her bowel movements are very small ("3 little stones"), or a little loose.  Not emptying out what she is eating. +straining. She feels bloated.  She tried Ex-Lax, helped some--small stools here and there. Doesn't feel like she is empty.   '"I feel like a radiator that needs a good flush"  Prior routine was 1 decent-sized bowel movement daily, hasn't had for about a month. She leaves for [place] in 3 weeks, for 3 weeks.  She is drinking a "ton" of water, urine is pale.   She has h/o diverticulitis. Denies fever, severe pain, blood in the stool.  There is family h/o thyroid issues in her sister. Lab Results  Component Value Date   TSH 3.200 04/30/2021   She notes slightly dryer skin. No change in hair loss. Nails are normal. Some increase in fatigue in the last month or so. Unable to lose weight (trying)  Osteoporosis--in discussing causes of constipation, she mentioned that she no longer takes calcium, stopped due to being on Prolia.     PMH, PSH, SH reviewed  Outpatient Encounter Medications as of 06/12/2022  Medication Sig Note   budesonide-formoterol (SYMBICORT) 80-4.5 MCG/ACT inhaler Inhale 2 puffs into the lungs 2 (two) times daily as needed (asthma). 06/12/2022: Uses every other day   Cholecalciferol  (VITAMIN D) 50 MCG (2000 UT) tablet Take 2,000 Units by mouth daily.    gabapentin (NEURONTIN) 100 MG capsule Take 2 capsules (200 mg total) by mouth at bedtime. 06/12/2022: Has not taken for the last month, except for 1 time   sertraline (ZOLOFT) 100 MG tablet Take 100 mg by mouth daily.    albuterol (VENTOLIN HFA) 108 (90 Base) MCG/ACT inhaler Inhale 2 puffs into the lungs every 6 (six) hours as needed for wheezing or shortness of breath. (Patient not taking: Reported on 11/27/2021) 10/14/2021: Uses prn   fluorometholone (FML) 0.1 % ophthalmic suspension SMARTSIG:In Eye(s) (Patient not taking: Reported on 10/09/2021) 10/09/2021: Uses as needed   levocetirizine (XYZAL) 5 MG tablet Take 1 tablet (5 mg total) by mouth every evening. (Patient not taking: Reported on 06/12/2022) 06/12/2022: prn   [DISCONTINUED] benzonatate (TESSALON) 200 MG capsule Take 1 capsule (200 mg total) by mouth 3 (three) times daily as needed for cough.    [DISCONTINUED] Omega-3 Fatty Acids (FISH OIL) 1000 MG CAPS Take by mouth.    No facility-administered encounter medications on file as of 06/12/2022.   She had been taking 200 mg of gabapentin once daily when she was working.  She stopped taking this about a month ago.  Allergies  Allergen Reactions   Bee Venom Anaphylaxis and Swelling   Shellfish Allergy Anaphylaxis   Codeine Nausea And Vomiting   Sulfonamide Derivatives Nausea And Vomiting   Tetracycline     Unsure about this med 08/28/15  ROS: no fever, chills URI symptoms, headaches, dizziness, chest pain, shortness of breath, nausea, vomiting, heartburn.  Denies hematochezia or melena. No urinary complaints.  +constipation, straining/bloating per HPI. Some trouble losing weight.  Denies significant changes to hair/skin/nails. Skin may be a little drier than usual.  A little more tired recently.   PHYSICAL EXAM:  BP 124/76   Pulse 72   Temp 97.6 F (36.4 C) (Tympanic)   Ht '5\' 3"'$  (1.6 m)   Wt 111 lb 9.6 oz  (50.6 kg)   BMI 19.77 kg/m   Well-appearing female, in good spirits. HEENT: conjunctiva and sclera are clear, EOMI. OP clear Neck: No lymphadenopathy, thyromegaly or mass Heart: regular rate and rhythm Lungs: clear bilaterally Back: no spinal or CVA tenderness Abdomen: normal bowel sounds, soft, no organomegaly or mass Extremities: no edema, normal pulses Psych: normal mood, affect, hygiene and grooming Neuro: alert and oriented, cranial nerves grossly intact, normal gait.   ASSESSMENT/PLAN:  Constipation, unspecified constipation type - counseled re: diet, water, fiber, stool softeners, miralax. Consider TSH if persists, worsening fatigue wt gain or other changes  Age-related osteoporosis without current pathological fracture - on Prolia. She no longer takes calcium. Discussed Ca (diet vs supplements which can be constipating), D, and weight-bearing exercise  Diverticulosis - pt states she has h/o diverticulitis. Reassurred no e/o diverticulitis on exam, reviewed s/sx. High fiber diet encouraged.   I spent 32 minutes dedicated to the care of this patient, including pre-visit review of records, face to face time, post-visit ordering of testing and documentation.

## 2022-06-19 ENCOUNTER — Telehealth: Payer: Self-pay | Admitting: Family Medicine

## 2022-06-19 ENCOUNTER — Encounter: Payer: Self-pay | Admitting: Family Medicine

## 2022-06-19 ENCOUNTER — Ambulatory Visit
Admission: RE | Admit: 2022-06-19 | Discharge: 2022-06-19 | Disposition: A | Discharge status: Home / Self Care | Payer: PPO | Source: Ambulatory Visit | Attending: Family Medicine | Admitting: Family Medicine

## 2022-06-19 ENCOUNTER — Telehealth: Payer: Self-pay | Admitting: Physician Assistant

## 2022-06-19 ENCOUNTER — Ambulatory Visit (INDEPENDENT_AMBULATORY_CARE_PROVIDER_SITE_OTHER): Payer: PPO | Admitting: Family Medicine

## 2022-06-19 VITALS — BP 104/60 | HR 84 | Temp 98.6°F | Ht 63.0 in | Wt 111.0 lb

## 2022-06-19 DIAGNOSIS — I878 Other specified disorders of veins: Secondary | ICD-10-CM | POA: Diagnosis not present

## 2022-06-19 DIAGNOSIS — J439 Emphysema, unspecified: Secondary | ICD-10-CM | POA: Diagnosis not present

## 2022-06-19 DIAGNOSIS — K5900 Constipation, unspecified: Secondary | ICD-10-CM | POA: Diagnosis not present

## 2022-06-19 DIAGNOSIS — N949 Unspecified condition associated with female genital organs and menstrual cycle: Secondary | ICD-10-CM

## 2022-06-19 DIAGNOSIS — R1030 Lower abdominal pain, unspecified: Secondary | ICD-10-CM

## 2022-06-19 DIAGNOSIS — R319 Hematuria, unspecified: Secondary | ICD-10-CM

## 2022-06-19 DIAGNOSIS — K6389 Other specified diseases of intestine: Secondary | ICD-10-CM | POA: Diagnosis not present

## 2022-06-19 LAB — POCT URINALYSIS DIP (PROADVANTAGE DEVICE)
Bilirubin, UA: NEGATIVE
Glucose, UA: NEGATIVE mg/dL
Leukocytes, UA: NEGATIVE
Nitrite, UA: NEGATIVE
Protein Ur, POC: 100 mg/dL — AB
Specific Gravity, Urine: 1.03
Urobilinogen, Ur: 0.2
pH, UA: 6 (ref 5.0–8.0)

## 2022-06-19 MED ORDER — HYOSCYAMINE SULFATE 0.125 MG SL SUBL: 0.1250 mg | SUBLINGUAL_TABLET | SUBLINGUAL | 0 refills | Status: DC | PRN

## 2022-06-19 NOTE — Telephone Encounter (Signed)
Patient does not think she is constipated, not really eating much at all. Worsened straining when she tries to have a bowel movement. Pain/cramping has increased a lot. No fever, nausea or vomiting. The hyoscyamine is still good, expires Nov 2023. She is coming in at noon.

## 2022-06-19 NOTE — Telephone Encounter (Signed)
Called and left message asking patient to return my call.

## 2022-06-19 NOTE — Patient Instructions (Signed)
Continue to drink water. Go to [facility] (either [address]) for x-rays today. We are checking labs for infection, thyroid. Checking kidney test in case we need to get a CT scan of the abdomen and pelvis.  Will wait on x-ray and lab results to decide that. I should get the x-ray results today--if there is no sign of obstruction, you can start the miralax.

## 2022-06-19 NOTE — Telephone Encounter (Signed)
Please call pt-- Is constipation any better? (Was reporting passing only small hard stools, not emptying) Is she having any fever, any localization of pain, any n/v.  Can offer visit (double book at lunch?) if fever, worsening pain, or if labs wanted (may need to recheck TSH if persistent/worsening constipation, and would also do CBC to eval for infection). How old is her hyoscyamine that she tried?

## 2022-06-19 NOTE — Telephone Encounter (Signed)
Called pt back to fu on message from Dr.Knapp about her abdominal pain. No answer.

## 2022-06-19 NOTE — Progress Notes (Signed)
Chief Complaint  Patient presents with   Follow-up    Not eating much, not really having many bowel movements. Pain and cramping is much worse-lower abdomen. No fever, nausea or vomiting.    Seen 1 week ago with abdominal pain and constipation. She tried stool softener, increased her water intake, but she didn't notice much benefit. She bought Miralax but hasn't tried it yet. She hasn't been eating much.  Denies nausea, has no appetite. Feels bloated, distended. She has been having cramps, getting more intense.  She has been passing just small amounts of gas. Had a small amount of liquid (watery mucus) pass rectally yesterday.  Last solid stool was 2 days ago, very small and hard. Denies any blood in the stool.  PMH, PSH, SH reviewed  Outpatient Encounter Medications as of 06/19/2022  Medication Sig Note   albuterol (VENTOLIN HFA) 108 (90 Base) MCG/ACT inhaler Inhale 2 puffs into the lungs every 6 (six) hours as needed for wheezing or shortness of breath. 06/19/2022: Used today   budesonide-formoterol (SYMBICORT) 80-4.5 MCG/ACT inhaler Inhale 2 puffs into the lungs 2 (two) times daily as needed (asthma). 06/12/2022: Uses every other day   Cholecalciferol (VITAMIN D) 50 MCG (2000 UT) tablet Take 2,000 Units by mouth daily.    hyoscyamine (LEVSIN SL) 0.125 MG SL tablet Place 1-2 tablets (0.125-0.25 mg total) under the tongue every 4 (four) hours as needed for cramping.    sertraline (ZOLOFT) 100 MG tablet Take 100 mg by mouth daily.    [DISCONTINUED] hyoscyamine (LEVSIN SL) 0.125 MG SL tablet Place 0.125 mg under the tongue every 4 (four) hours as needed. 06/19/2022: Took one this am   fluorometholone (FML) 0.1 % ophthalmic suspension SMARTSIG:In Eye(s) (Patient not taking: Reported on 10/09/2021) 10/09/2021: Uses as needed   gabapentin (NEURONTIN) 100 MG capsule Take 2 capsules (200 mg total) by mouth at bedtime. (Patient not taking: Reported on 06/19/2022) 06/12/2022: Has not taken for the last  month, except for 1 time   levocetirizine (XYZAL) 5 MG tablet Take 1 tablet (5 mg total) by mouth every evening. (Patient not taking: Reported on 06/12/2022) 06/12/2022: prn   No facility-administered encounter medications on file as of 06/19/2022.   Allergies  Allergen Reactions   Bee Venom Anaphylaxis and Swelling   Shellfish Allergy Anaphylaxis   Codeine Nausea And Vomiting   Sulfonamide Derivatives Nausea And Vomiting   Tetracycline     Unsure about this med 08/28/15   ROS:  No fever, chills. No melena or hematochezia.  +lower abdominal pain, per HPI.  No urinary symptoms. No URI symptoms, body aches. Not sleeping well, due to ongoing cramps. Not sexually active in over a decade. No vaginal discharge   PHYSICAL EXAM:  BP 104/60   Pulse 84   Temp 98.6 F (37 C) (Tympanic)   Ht _0  (1.6 m)   Wt 111 lb (50.3 kg)   BMI 19.66 kg/m   Thin, but well-appearing female in no distress HEENT: conjunctiva and sclera are clear, anicteric Neck: no lymphadenopathy or mass Heart: regular rate and rhythm Lungs: clear bilaterally Back: no CVA or spinal tenderness Abdomen: Soft, no appreciable distension. Active bowel sounds. No hepatosplenomegaly. Tender across entire lower abdomen (LLQ, suprapubic and slightly higher, as well as RLQ).  No guarding or masses.  Slight rebound tenderness centrally. No epigastric tenderness Extremities: no edema Neuro: alert and oriented, cranial nerves intact, normal gait Psych: normal mood, hygiene, grooming  Urine--dark yellow, SG 1.030, small ketones, large blood, 100  protein    ASSESSMENT/PLAN:  Lower abdominal pain - DDx includes kidney stone, diverticulitis, constipation vs other pelvic pathology. Check labs for infxn, AAS to r/o obstruction. May need CT abd/pelvis - Plan: DG Abd Acute W/Chest, CBC with Differential/Platelet, Basic metabolic panel, hyoscyamine (LEVSIN SL) 0.125 MG SL tablet, POCT Urinalysis DIP (Proadvantage Device), Urine  Culture  Constipation, unspecified constipation type - per history--straining, distension, hard stools.  Rectal vault is pretty clear.  If no obstruction (and stool burden noted on XR), can use miralax. - Plan: TSH  Cervical motion tenderness - doubt PID, not c/w history. ?if could be a nearby stone, vs diverticulitis. May need CT, await labs  Hematuria, unspecified type - will send culture to r/o UTI, though not per hx. Consider stone, or bladder irritation from inflammation/divertic - Plan: Urine Culture   CBC, TSH, b-met (to have Cr if CT needed) AAS today CT abdomen/pelvis likely needed.  Continue to drink water. Go to [facility] (either [address]) for x-rays today. We are checking labs for infection, thyroid. Checking kidney test in case we need to get a CT scan of the abdomen and pelvis.  Will wait on x-ray and lab results to decide that. I should get the x-ray results today--if there is no sign of obstruction, you can start the miralax.  You can continue the hyocyamine (1-2 three times daily as needed for cramps). Try heating pad. You can try Gas-X in case any of your discomfort is from gas buildup (simethicone). Try extra strength Tylenol, if needed for pain. If these aren't working, we can consider tramadol (you can also take the gabapentin you have at home)   ADDENDUM:  Lab Results  Component Value Date   WBC 12.0 (H) 06/19/2022   HGB 13.9 06/19/2022   HCT 41.2 06/19/2022   MCV 94 06/19/2022   PLT 282 06/19/2022   Lab Results  Component Value Date   TSH 4.870 (H) 06/19/2022   B-met--glu 106, Cr 0.83, BUN 13, nl electrolytes  Acute abdominal series:  IMPRESSION: 1. Chronic emphysematous changes.  No acute lung process. 2. Mild stool burden.  No bowel obstruction.   Rec CT abd/pelvis with contrast.

## 2022-06-19 NOTE — Telephone Encounter (Signed)
Pt called in and states she seen you last week and her abdominal pain hasn't gotten better, she says the past two days it has been feeling very bad. She asks if you can call her in a prescription or will she need to come in again.   Also she let me know she has been trying to use Hyoscyamine .'125mg'$  that she found in her medicine tablet and says it isn't helping.

## 2022-06-20 ENCOUNTER — Other Ambulatory Visit: Payer: Self-pay

## 2022-06-20 ENCOUNTER — Telehealth: Payer: Self-pay | Admitting: Family Medicine

## 2022-06-20 DIAGNOSIS — R319 Hematuria, unspecified: Secondary | ICD-10-CM

## 2022-06-20 DIAGNOSIS — R109 Unspecified abdominal pain: Secondary | ICD-10-CM

## 2022-06-20 LAB — BASIC METABOLIC PANEL
BUN/Creatinine Ratio: 16 (ref 12–28)
BUN: 13 mg/dL (ref 8–27)
CO2: 22 mmol/L (ref 20–29)
Calcium: 9.6 mg/dL (ref 8.7–10.3)
Chloride: 102 mmol/L (ref 96–106)
Creatinine, Ser: 0.83 mg/dL (ref 0.57–1.00)
Glucose: 106 mg/dL — ABNORMAL HIGH (ref 70–99)
Potassium: 5 mmol/L (ref 3.5–5.2)
Sodium: 141 mmol/L (ref 134–144)
eGFR: 74 mL/min/{1.73_m2} (ref >59)

## 2022-06-20 LAB — CBC WITH DIFFERENTIAL/PLATELET
Basophils Absolute: 0 10*3/uL (ref 0.0–0.2)
Basos: 0 %
EOS (ABSOLUTE): 0.4 10*3/uL (ref 0.0–0.4)
Eos: 3 %
Hematocrit: 41.2 % (ref 34.0–46.6)
Hemoglobin: 13.9 g/dL (ref 11.1–15.9)
Immature Grans (Abs): 0 10*3/uL (ref 0.0–0.1)
Immature Granulocytes: 0 %
Lymphocytes Absolute: 1.9 10*3/uL (ref 0.7–3.1)
Lymphs: 16 %
MCH: 31.6 pg (ref 26.6–33.0)
MCHC: 33.7 g/dL (ref 31.5–35.7)
MCV: 94 fL (ref 79–97)
Monocytes Absolute: 1.3 10*3/uL — ABNORMAL HIGH (ref 0.1–0.9)
Monocytes: 11 %
Neutrophils Absolute: 8.4 10*3/uL — ABNORMAL HIGH (ref 1.4–7.0)
Neutrophils: 70 %
Platelets: 282 10*3/uL (ref 150–450)
RBC: 4.4 x10E6/uL (ref 3.77–5.28)
RDW: 11.5 % — ABNORMAL LOW (ref 11.7–15.4)
WBC: 12 10*3/uL — ABNORMAL HIGH (ref 3.4–10.8)

## 2022-06-20 LAB — TSH: TSH: 4.87 u[IU]/mL — ABNORMAL HIGH (ref 0.450–4.500)

## 2022-06-20 NOTE — Telephone Encounter (Signed)
CT Appointment 

## 2022-06-21 ENCOUNTER — Encounter: Payer: Self-pay | Admitting: Family Medicine

## 2022-06-21 LAB — URINE CULTURE: Organism ID, Bacteria: NO GROWTH

## 2022-06-23 ENCOUNTER — Ambulatory Visit
Admission: RE | Admit: 2022-06-23 | Discharge: 2022-06-23 | Disposition: A | Discharge status: Home / Self Care | Payer: PPO | Source: Ambulatory Visit | Attending: Family Medicine | Admitting: Family Medicine

## 2022-06-23 DIAGNOSIS — R319 Hematuria, unspecified: Secondary | ICD-10-CM

## 2022-06-23 DIAGNOSIS — K8020 Calculus of gallbladder without cholecystitis without obstruction: Secondary | ICD-10-CM | POA: Diagnosis not present

## 2022-06-23 DIAGNOSIS — K5730 Diverticulosis of large intestine without perforation or abscess without bleeding: Secondary | ICD-10-CM | POA: Diagnosis not present

## 2022-06-23 DIAGNOSIS — K7689 Other specified diseases of liver: Secondary | ICD-10-CM | POA: Diagnosis not present

## 2022-06-23 DIAGNOSIS — R109 Unspecified abdominal pain: Secondary | ICD-10-CM

## 2022-06-23 MED ORDER — IOPAMIDOL (ISOVUE-300) INJECTION 61%
100.0000 mL | Freq: Once | INTRAVENOUS | Status: AC | PRN
  Administered 2022-06-23: 100 mL via INTRAVENOUS

## 2022-06-23 NOTE — Progress Notes (Signed)
Spoke with pt (who already saw results). She started feeling better over the weekend. Took Miralax, and had a normal bowel movement yesterday.  She ended up having explosive diarrhea after the scan (and contrast).

## 2022-06-30 ENCOUNTER — Telehealth: Payer: Self-pay | Admitting: Physician Assistant

## 2022-06-30 NOTE — Telephone Encounter (Signed)
Pt called in and says she isn't feeling any better and she is wondering if you could call her in a antibiotic, She has no fever or extra symptoms.

## 2022-06-30 NOTE — Telephone Encounter (Signed)
Patient given Dr. Johnsie Kindred recommendations.

## 2022-06-30 NOTE — Telephone Encounter (Signed)
She was feeling better when we last spoke.  Please call her and see what is going on (and whether or not a visit may be needed)

## 2022-06-30 NOTE — Telephone Encounter (Signed)
Spoke with patient and she is feeling better in general, she is still struggling with loose stools. She is having frequent issues with having to run to the bathroom. She said she was given Cipro in the past and felt like this helped very quickly and was wondering if you could call this in for her. She is supposed to be leaving for her trip in 3 days.

## 2022-06-30 NOTE — Telephone Encounter (Signed)
Left message for patient to return my call.

## 2022-06-30 NOTE — Telephone Encounter (Signed)
With CT not showing any diverticulitis, with no WORSENING of symptoms (fever, abdominal pain), I'd prefer to not use antibiotics (which in and of themself can cause diarrhea and c.diff). She can use imodium as needed for diarrhea. She should limit/avoid dairy. She may want to take probiotics to help the gut flora return to normal.

## 2022-07-23 ENCOUNTER — Encounter: Payer: Self-pay | Admitting: Internal Medicine

## 2022-07-24 ENCOUNTER — Telehealth: Payer: Self-pay | Admitting: Physician Assistant

## 2022-07-24 NOTE — Telephone Encounter (Signed)
Left message for patient to call back and schedule Medicare Annual Wellness Visit (AWV) either virtually or in office. I left my number for patient to call [phone].  Last AWV 04/30/21  please schedule at anytime with health coach

## 2022-08-26 ENCOUNTER — Encounter: Payer: Self-pay | Admitting: Internal Medicine

## 2022-09-30 ENCOUNTER — Encounter: Payer: Self-pay | Admitting: Internal Medicine

## 2022-10-16 ENCOUNTER — Telehealth: Payer: Self-pay | Admitting: Physician Assistant

## 2022-10-16 NOTE — Telephone Encounter (Signed)
Left message for patient to call back and schedule Medicare Annual Wellness Visit (AWV) either virtually or in office. Left  my Deborah Bray number [phone]   Last AWV  04/30/21  please schedule with Nurse Health Adviser   45 min for awv-i and in office appointments 30 min for awv-s  phone/virtual appointments

## 2022-10-29 ENCOUNTER — Other Ambulatory Visit: Payer: Self-pay | Admitting: Family Medicine

## 2022-11-12 ENCOUNTER — Encounter: Payer: Self-pay | Admitting: Family Medicine

## 2022-11-12 ENCOUNTER — Ambulatory Visit (INDEPENDENT_AMBULATORY_CARE_PROVIDER_SITE_OTHER): Payer: PPO | Admitting: Family Medicine

## 2022-11-12 ENCOUNTER — Telehealth: Payer: Self-pay | Admitting: Family Medicine

## 2022-11-12 VITALS — BP 112/74 | HR 76 | Temp 98.1°F | Wt 115.2 lb

## 2022-11-12 DIAGNOSIS — S0012XA Contusion of left eyelid and periocular area, initial encounter: Secondary | ICD-10-CM | POA: Diagnosis not present

## 2022-11-12 NOTE — Telephone Encounter (Signed)
Patient was seen by her PCP today.

## 2022-11-12 NOTE — Progress Notes (Signed)
Chief Complaint  Patient presents with   other    [place] on 11/09/22 hit your head first, hit head on sidewalk, jogging with the dog when she fell, did not get knocked out, had a mild head ache first day since then been getting worse head aches, did bleed from lt. Eye brow, EMS did check her out when she fell pt. Refused transport to hospital at the time,    Trying to jog along behind her dog (73 yo Husky mix). She was not being pulled.  The concrete was slightly raised, and she thinks the sneakers she was wearing might have been a factor--she tripped and fell.  She hit her left forehead (above the left eyebrow).  It bled profusely, only swelled a little.  No LOC that she is aware of.  She hit her head first, then her left shoulder and rolled. EMS came, she declined transport to [facility]. They did a brief neuro exam and vitals were okay.  She presents today for evaluation due to increase in headache.  The headache starts at the L temple, and goes down the left side of the face to her upper jaw. Took 2 advil last night for the pain, which helped. Also has some pain at the back of her head, starts at the base, comes up and "joins" the pain on the left side.  No NSAIDs prior to fall, just some ibuprofen at 5am for headache. Not anticoagulated. She denies nausea, vomiting, numbness, tingling, issues with speech, memory, strength, confusion or other neuro concerns.  She took a nap before her appointment, felt very heavy/tired when she woke up.  Not sleeping well at night (unchanged).    PMH, PSH, SH reviewed  Outpatient Encounter Medications as of 11/12/2022  Medication Sig Note   budesonide-formoterol (SYMBICORT) 80-4.5 MCG/ACT inhaler Inhale 2 puffs into the lungs 2 (two) times daily as needed (asthma). 06/12/2022: Uses every other day   Cholecalciferol (VITAMIN D) 50 MCG (2000 UT) tablet Take 2,000 Units by mouth daily.    fluorometholone (FML) 0.1 % ophthalmic suspension  10/09/2021: Uses as needed    gabapentin (NEURONTIN) 100 MG capsule TAKE 2 CAPSULES(200 MG) BY MOUTH AT BEDTIME    levocetirizine (XYZAL) 5 MG tablet Take 1 tablet (5 mg total) by mouth every evening. 06/12/2022: prn   Probiotic Product (PRO-BIOTIC BLEND PO) Take 1 capsule by mouth daily.    sertraline (ZOLOFT) 100 MG tablet Take 100 mg by mouth daily.    albuterol (VENTOLIN HFA) 108 (90 Base) MCG/ACT inhaler Inhale 2 puffs into the lungs every 6 (six) hours as needed for wheezing or shortness of breath. (Patient not taking: Reported on 11/12/2022) 06/19/2022: Used today   hyoscyamine (LEVSIN SL) 0.125 MG SL tablet Place 1-2 tablets (0.125-0.25 mg total) under the tongue every 4 (four) hours as needed for cramping. (Patient not taking: Reported on 11/12/2022) 11/12/2022: Prn has not used in a long time.   No facility-administered encounter medications on file as of 11/12/2022.   Allergies  Allergen Reactions   Bee Venom Anaphylaxis and Swelling   Shellfish Allergy Anaphylaxis   Codeine Nausea And Vomiting   Sulfonamide Derivatives Nausea And Vomiting   Tetracycline     Unsure about this med 08/28/15   ROS: no fever, chills, URI symptoms, n/v/d, urinary complaints, numbness, tingling, weakness, seizures, memory concerns, confusion. +L sided headache per HPI. No change in vision, slight watering of both eyes. No runny nose or drainage.   PHYSICAL EXAM:  BP 112/74  Pulse 76   Temp 98.1 F (36.7 C)   Wt 115 lb 3.2 oz (52.3 kg)   BMI 20.41 kg/m   Wt Readings from Last 3 Encounters:  11/12/22 115 lb 3.2 oz (52.3 kg)  06/19/22 111 lb (50.3 kg)  06/12/22 111 lb 9.6 oz (50.6 kg)   Well-appearing, pleasant female, in no distress HEENT: Small abrasion at the lateral aspect of her left eyebrow, with some scabbing and yellow ecchymosis in this area.  There is only a small amount of soft tissue swelling. PERRL EOMI, conjunctiva and sclera are clear. Fundi benign. Neck: no spinal tenderness, no lymphadenopathy Heart:  regular rate and rhythm Lungs: clear Neuro: alert and oriented, cranial nerves 2-12 intact. Normal finger to nose, negative Romberg, normal tandem gait. Normal strength, sensation, DTR's 2+ and symmetric. Psych: normal mood, affect, hygiene and grooming   ASSESSMENT/PLAN:  Contusion of left periocular region, initial encounter - Fell, hit head. Superficial abrasion and swelling. Normal neuro exam.  Reviewed s/sx for which she should seek immediate re-eval. Pt reassured  Discussed head injuries, risk factors (she doesn't have). Discussed potential complications and treatments, evaluation. Normal neuro exam today. S/sx for which she should seek re-eval were reviewed in detail. Reviewed handout, and answered all questions.  I spent 35 minutes dedicated to the care of this patient, including pre-visit review of records, face to face time, post-visit ordering of testing and documentation.

## 2022-11-12 NOTE — Telephone Encounter (Signed)
Tried to call patient as well. No answer.

## 2022-11-12 NOTE — Telephone Encounter (Signed)
This is a patient of Dr Tamala Julian who called stating that she fell on Sunday and hit her head on concrete.  She said that she hit above her left eye. It is swollen and has some pressure in that area. She also states that her "eye feels funny". Her biggest concern is a worsening headache.   She has not been evaluated.  Please advise on scheduling with concussion clinic or should she been seen somewhere else?

## 2022-11-12 NOTE — Patient Instructions (Signed)
Your neurologic exam was normal. I suspect that your headache is related to the injury, which is still a little swollen, and needs time to heal. Try and sleep with the head of the bed elevated. Use tylenol as needed for pain (and advil if that doesn't work).  If any neurologic symptoms develop or worsen, you will need CT scan to image the brain and skull.  Please seek immediate care if these symptoms develop (see handout).  Try icing the temple as needed (vs heat, whichever feels better).

## 2022-11-12 NOTE — Telephone Encounter (Signed)
Was unable to reach pt at either phone number. Left a VM on her cell, advising to proceed to the ED immediately, as worsening symptoms are concerning. Left our office number to call back if further questions.

## 2022-11-26 ENCOUNTER — Telehealth: Payer: Self-pay | Admitting: Nurse Practitioner

## 2022-11-26 NOTE — Telephone Encounter (Signed)
Left message for patient to call back and schedule Medicare Annual Wellness Visit (AWV) either virtually or in office. Left  my Deborah Bray number [phone]   Last AWV 04/30/21 please schedule with Nurse Health Adviser   45 min for awv-i  in office appointments 30 min for awv-s & awv-I  phone/virtual appointments

## 2022-12-03 ENCOUNTER — Other Ambulatory Visit (HOSPITAL_COMMUNITY): Payer: Self-pay

## 2022-12-12 DIAGNOSIS — J301 Allergic rhinitis due to pollen: Secondary | ICD-10-CM | POA: Diagnosis not present

## 2022-12-12 DIAGNOSIS — J4540 Moderate persistent asthma, uncomplicated: Secondary | ICD-10-CM | POA: Diagnosis not present

## 2022-12-12 DIAGNOSIS — J3081 Allergic rhinitis due to animal (cat) (dog) hair and dander: Secondary | ICD-10-CM | POA: Diagnosis not present

## 2022-12-12 DIAGNOSIS — J3089 Other allergic rhinitis: Secondary | ICD-10-CM | POA: Diagnosis not present

## 2022-12-15 ENCOUNTER — Telehealth: Payer: Self-pay | Admitting: Nurse Practitioner

## 2022-12-15 ENCOUNTER — Other Ambulatory Visit (HOSPITAL_COMMUNITY): Payer: Self-pay

## 2022-12-15 MED ORDER — DENOSUMAB 60 MG/ML ~~LOC~~ SOSY
PREFILLED_SYRINGE | SUBCUTANEOUS | 0 refills | Status: AC
  Filled 2022-12-15: qty 1, 180d supply, fill #0

## 2022-12-15 NOTE — Telephone Encounter (Signed)
Pt called me back concerning prolia. Pt was advised that ordering thru [facility] specialty pharmacy would be less expensive costing her $200 and then a $23 admin fee here. She said to go ahead a order it. I called into speciality pharmacy. They will reach out to pt for payment. Appt was made for pt for Thursday and she was advised to call so medication could be set out. Pt verbalized understanding.

## 2022-12-16 ENCOUNTER — Other Ambulatory Visit: Payer: Self-pay

## 2022-12-17 ENCOUNTER — Other Ambulatory Visit (HOSPITAL_COMMUNITY): Payer: Self-pay

## 2022-12-17 ENCOUNTER — Other Ambulatory Visit: Payer: Self-pay

## 2022-12-18 ENCOUNTER — Other Ambulatory Visit: Payer: PPO

## 2022-12-18 DIAGNOSIS — M810 Age-related osteoporosis without current pathological fracture: Secondary | ICD-10-CM

## 2022-12-18 MED ORDER — DENOSUMAB 60 MG/ML ~~LOC~~ SOSY
60.0000 mg | PREFILLED_SYRINGE | Freq: Once | SUBCUTANEOUS | Status: AC
  Administered 2022-12-18: 60 mg via SUBCUTANEOUS

## 2023-01-14 ENCOUNTER — Ambulatory Visit (INDEPENDENT_AMBULATORY_CARE_PROVIDER_SITE_OTHER): Payer: PPO | Admitting: Nurse Practitioner

## 2023-01-14 ENCOUNTER — Encounter: Payer: Self-pay | Admitting: Nurse Practitioner

## 2023-01-14 VITALS — BP 120/72 | HR 75 | Wt 112.4 lb

## 2023-01-14 DIAGNOSIS — B370 Candidal stomatitis: Secondary | ICD-10-CM | POA: Insufficient documentation

## 2023-01-14 MED ORDER — NYSTATIN 100000 UNIT/ML MT SUSP: 5.0000 mL | Freq: Four times a day (QID) | OROMUCOSAL | 2 refills | Status: DC

## 2023-01-14 MED ORDER — FLUCONAZOLE 150 MG PO TABS: ORAL_TABLET | ORAL | 2 refills | Status: DC

## 2023-01-14 NOTE — Patient Instructions (Signed)
I have sent in a pill for you to take once every 3 days for 3 doses. This will help heal from the inside out.   I have also send in a swish to use 4 times a day, swish then swallow this medication. You can use this until the bottle is gone.

## 2023-01-14 NOTE — Progress Notes (Signed)
Orma Render, DNP, AGNP-c [place] [address] [place], [place] [address] [phone]  Subjective:   Deborah Bray is a 72 y.o. female presents to day for evaluation of: Stanton Kidney presents today with concerns regarding a persistent burning sensation on her tongue that has been ongoing for the past two weeks. She describes initially noticing a small sore on the tip of her tongue, which she attributed to a possible burn from hot coffee. Over time, the sensation has spread to the sides and eventually the middle of her tongue. She notes that while the middle of her tongue appeared red the day before the visit, it was not red on the day of the visit. The patient has been attempting to identify potential causes, such as her toothbrush bristles or her inhaler, but has yet to determine a definitive cause.  Additionally, the patient reports a sensation of puffiness in her throat, which she does not characterize as soreness but does notice when swallowing. She has a long-term history of using a Symbicort inhaler for 15 years and acknowledges that she does not consistently rinse her mouth after use. She also mentions a recent fever blister, which she believes may have impacted her immune system. The patient is employed at [employer] and confirms that there are no contagious concerns related to her symptoms.  PMH, Medications, and Allergies reviewed and updated in chart as appropriate.   ROS negative except for what is listed in HPI. Objective:  BP 120/72   Pulse 75   Wt 112 lb 6.4 oz (51 kg)   BMI 19.91 kg/m  Physical Exam Vitals and nursing note reviewed.  Constitutional:      Appearance: Normal appearance. She is not ill-appearing.  HENT:     Head: Normocephalic and atraumatic.     Nose: Nose normal.     Mouth/Throat:     Mouth: Mucous membranes are moist. Oral lesions present.     Tongue: Lesions present.     Pharynx: Posterior oropharyngeal erythema present.  Eyes:      Pupils: Pupils are equal, round, and reactive to light.  Cardiovascular:     Rate and Rhythm: Normal rate.  Pulmonary:     Effort: Pulmonary effort is normal.  Musculoskeletal:     Cervical back: Normal range of motion.  Lymphadenopathy:     Cervical: No cervical adenopathy.  Skin:    General: Skin is warm and dry.     Capillary Refill: Capillary refill takes less than 2 seconds.  Neurological:     Mental Status: She is alert.  Psychiatric:        Mood and Affect: Mood normal.           Assessment & Plan:   Problem List Items Addressed This Visit     Oral thrush - Primary    The patient reports a two-week history of tongue discomfort, with symptoms progressing from a sensation akin to a burn from hot coffee to redness and a feeling of swelling at the back of the throat. A possible sore on the tip of the tongue and throat puffiness were observed, without significant soreness. The patient's use of a Symbicort inhaler, which contains steroids, may predispose to oral yeast overgrowth. Additionally, a recent fever blister suggests a potential transient dip in immune function. A diagnosis of oral candidiasis has been made based on the clinical presentation and history. Plan: - Prescribe Nystatin oral suspension with instructions for the patient to swish and swallow four times  daily. - Prescribe an antifungal pill with a dosing schedule of one dose today, another in three days, and a final dose three days following the second. - Advise the patient to continue using the Symbicort inhaler as prescribed, with the added instruction to rinse the mouth after use to prevent recurrence. - Provide refills for the antifungal medication and instruct the patient to initiate treatment Alisan Dokes if symptoms recur. - Reassure the patient that oral candidiasis is typically not contagious and that the immune system generally prevents transmission. - Schedule a follow-up for the patient to report if symptoms  do not improve with the prescribed treatment.      Relevant Medications   fluconazole (DIFLUCAN) 150 MG tablet   nystatin (MYCOSTATIN) 100000 UNIT/ML suspension      Orma Render, DNP, AGNP-c 01/14/2023  7:01 PM    History, Medications, Surgery, SDOH, and Family History reviewed and updated as appropriate.

## 2023-01-14 NOTE — Assessment & Plan Note (Signed)
The patient reports a two-week history of tongue discomfort, with symptoms progressing from a sensation akin to a burn from hot coffee to redness and a feeling of swelling at the back of the throat. A possible sore on the tip of the tongue and throat puffiness were observed, without significant soreness. The patient's use of a Symbicort inhaler, which contains steroids, may predispose to oral yeast overgrowth. Additionally, a recent fever blister suggests a potential transient dip in immune function. A diagnosis of oral candidiasis has been made based on the clinical presentation and history. Plan: - Prescribe Nystatin oral suspension with instructions for the patient to swish and swallow four times daily. - Prescribe an antifungal pill with a dosing schedule of one dose today, another in three days, and a final dose three days following the second. - Advise the patient to continue using the Symbicort inhaler as prescribed, with the added instruction to rinse the mouth after use to prevent recurrence. - Provide refills for the antifungal medication and instruct the patient to initiate treatment Isis Costanza if symptoms recur. - Reassure the patient that oral candidiasis is typically not contagious and that the immune system generally prevents transmission. - Schedule a follow-up for the patient to report if symptoms do not improve with the prescribed treatment.

## 2023-01-21 ENCOUNTER — Telehealth: Payer: Self-pay | Admitting: Nurse Practitioner

## 2023-01-21 DIAGNOSIS — B370 Candidal stomatitis: Secondary | ICD-10-CM

## 2023-01-21 NOTE — Telephone Encounter (Signed)
Pt called and states that she finished the mouthwash today and is not any better and is worse. She states that now her tongue is coated. Pt uses [facility] corner of [place] and [id number]. Pt can be reached at [phone] pr [phone]. Sending to Laretta Bolster and Quita Skye to keep an eye on.

## 2023-01-22 MED ORDER — NYSTATIN 100000 UNIT/ML MT SUSP: 5.0000 mL | Freq: Four times a day (QID) | OROMUCOSAL | 2 refills | Status: DC

## 2023-01-22 NOTE — Telephone Encounter (Signed)
The original script had two refills available with instructions to repeat if symptoms persisted, which is why I did not send more earlier. I would like her to continue with the mouthwash for at least 2 additional weeks.

## 2023-01-26 ENCOUNTER — Other Ambulatory Visit: Payer: Self-pay | Admitting: Family Medicine

## 2023-01-26 DIAGNOSIS — R1030 Lower abdominal pain, unspecified: Secondary | ICD-10-CM

## 2023-02-06 ENCOUNTER — Telehealth: Payer: Self-pay

## 2023-02-06 DIAGNOSIS — B370 Candidal stomatitis: Secondary | ICD-10-CM

## 2023-02-06 DIAGNOSIS — R208 Other disturbances of skin sensation: Secondary | ICD-10-CM

## 2023-02-06 DIAGNOSIS — R7989 Other specified abnormal findings of blood chemistry: Secondary | ICD-10-CM

## 2023-02-06 DIAGNOSIS — E7800 Pure hypercholesterolemia, unspecified: Secondary | ICD-10-CM

## 2023-02-06 DIAGNOSIS — M810 Age-related osteoporosis without current pathological fracture: Secondary | ICD-10-CM

## 2023-02-06 MED ORDER — LIDOCAINE VISCOUS HCL 2 % MT SOLN: 15.0000 mL | OROMUCOSAL | 0 refills | Status: DC | PRN

## 2023-02-06 NOTE — Telephone Encounter (Signed)
At this point I think we need to get some labs to see if there is something else that could be contributing. This should be improved with the use of the medication by now. I have ordered labs and she can come by to have these done at any time. I have sent in lidocaine swish to see if this helps with the symptoms

## 2023-02-06 NOTE — Telephone Encounter (Signed)
Pt. Called stating that she is running out of medicine for her yeast infection in her mouth and it is still doing about the same. She has enough medicine for tomorrow but she is not sure if she needs to try a different medicine at this point or come back and see you again.

## 2023-02-10 ENCOUNTER — Other Ambulatory Visit: Payer: PPO

## 2023-02-10 DIAGNOSIS — B370 Candidal stomatitis: Secondary | ICD-10-CM

## 2023-02-10 DIAGNOSIS — M810 Age-related osteoporosis without current pathological fracture: Secondary | ICD-10-CM

## 2023-02-10 DIAGNOSIS — E7800 Pure hypercholesterolemia, unspecified: Secondary | ICD-10-CM | POA: Diagnosis not present

## 2023-02-10 DIAGNOSIS — R7989 Other specified abnormal findings of blood chemistry: Secondary | ICD-10-CM

## 2023-02-10 DIAGNOSIS — R208 Other disturbances of skin sensation: Secondary | ICD-10-CM | POA: Diagnosis not present

## 2023-02-11 LAB — VITAMIN B12: Vitamin B-12: 348 pg/mL (ref 232–1245)

## 2023-02-11 LAB — IRON,TIBC AND FERRITIN PANEL
Ferritin: 153 ng/mL — ABNORMAL HIGH (ref 15–150)
Iron Saturation: 34 % (ref 15–55)
Iron: 109 ug/dL (ref 27–139)
Total Iron Binding Capacity: 319 ug/dL (ref 250–450)
UIBC: 210 ug/dL (ref 118–369)

## 2023-02-11 LAB — COMPREHENSIVE METABOLIC PANEL
ALT: 14 IU/L (ref 0–32)
AST: 23 IU/L (ref 0–40)
Albumin/Globulin Ratio: 1.6 (ref 1.2–2.2)
Albumin: 4.6 g/dL (ref 3.8–4.8)
Alkaline Phosphatase: 49 IU/L (ref 44–121)
BUN/Creatinine Ratio: 22 (ref 12–28)
BUN: 21 mg/dL (ref 8–27)
Bilirubin Total: 0.4 mg/dL (ref 0.0–1.2)
CO2: 22 mmol/L (ref 20–29)
Calcium: 10.5 mg/dL — ABNORMAL HIGH (ref 8.7–10.3)
Chloride: 98 mmol/L (ref 96–106)
Creatinine, Ser: 0.97 mg/dL (ref 0.57–1.00)
Globulin, Total: 2.9 g/dL (ref 1.5–4.5)
Glucose: 122 mg/dL — ABNORMAL HIGH (ref 70–99)
Potassium: 4.9 mmol/L (ref 3.5–5.2)
Sodium: 140 mmol/L (ref 134–144)
Total Protein: 7.5 g/dL (ref 6.0–8.5)
eGFR: 62 mL/min/{1.73_m2} (ref >59)

## 2023-02-11 LAB — CBC WITH DIFFERENTIAL/PLATELET
Basophils Absolute: 0.1 10*3/uL (ref 0.0–0.2)
Basos: 1 %
EOS (ABSOLUTE): 0.4 10*3/uL (ref 0.0–0.4)
Eos: 6 %
Hematocrit: 40.1 % (ref 34.0–46.6)
Hemoglobin: 13.7 g/dL (ref 11.1–15.9)
Immature Grans (Abs): 0 10*3/uL (ref 0.0–0.1)
Immature Granulocytes: 0 %
Lymphocytes Absolute: 1.9 10*3/uL (ref 0.7–3.1)
Lymphs: 26 %
MCH: 31.1 pg (ref 26.6–33.0)
MCHC: 34.2 g/dL (ref 31.5–35.7)
MCV: 91 fL (ref 79–97)
Monocytes Absolute: 0.7 10*3/uL (ref 0.1–0.9)
Monocytes: 10 %
Neutrophils Absolute: 4.2 10*3/uL (ref 1.4–7.0)
Neutrophils: 57 %
Platelets: 246 10*3/uL (ref 150–450)
RBC: 4.41 x10E6/uL (ref 3.77–5.28)
RDW: 12.3 % (ref 11.7–15.4)
WBC: 7.3 10*3/uL (ref 3.4–10.8)

## 2023-02-11 LAB — VITAMIN D 25 HYDROXY (VIT D DEFICIENCY, FRACTURES): Vit D, 25-Hydroxy: 41.9 ng/mL (ref 30.0–100.0)

## 2023-02-11 LAB — TSH: TSH: 3.44 u[IU]/mL (ref 0.450–4.500)

## 2023-02-16 ENCOUNTER — Encounter: Payer: Self-pay | Admitting: Nurse Practitioner

## 2023-03-26 ENCOUNTER — Encounter: Payer: Self-pay | Admitting: Nurse Practitioner

## 2023-03-26 ENCOUNTER — Telehealth (INDEPENDENT_AMBULATORY_CARE_PROVIDER_SITE_OTHER): Payer: PPO | Admitting: Nurse Practitioner

## 2023-03-26 VITALS — Wt 110.0 lb

## 2023-03-26 DIAGNOSIS — G4709 Other insomnia: Secondary | ICD-10-CM | POA: Diagnosis not present

## 2023-03-26 DIAGNOSIS — Z7689 Persons encountering health services in other specified circumstances: Secondary | ICD-10-CM

## 2023-03-26 DIAGNOSIS — G479 Sleep disorder, unspecified: Secondary | ICD-10-CM

## 2023-04-15 DIAGNOSIS — Z7689 Persons encountering health services in other specified circumstances: Secondary | ICD-10-CM | POA: Insufficient documentation

## 2023-04-15 NOTE — Assessment & Plan Note (Signed)
Symptoms of inability to move upon awakening every morning, accompanied by a sense of heaviness and shallow breathing. The episodes occur every morning and can last for several minutes before resolving on their own. 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  Plan: - Referral for sleep evaluation - Neurology referral - Practice good sleep hygiene

## 2023-04-15 NOTE — Progress Notes (Signed)
Virtual Visit Encounter mychart visit.   I connected with  Nicholas Lose on 03/26/23 at  4:30 PM EDT by secure video and audio telemedicine application. I verified that I am speaking with the correct person using two identifiers.   I introduced myself as a Publishing rights manager with the practice. The limitations of evaluation and management by telemedicine discussed with the patient and the availability of in person appointments. The patient expressed verbal understanding and consent to proceed.  Participating parties in this visit include: Myself and patient  The patient is: Patient Location: Home I am: Provider Location: Office/Clinic Subjective:    CC and HPI: Gerren Deroos is a 72 y.o. year old female presenting for new evaluation and treatment of sleep concerns.  Corrie Dandy presents with episodes where she wakes up but is unable to move. She reports her limbs are heavy and she feels as though her body is sinking into the mattress. She reports she is able to breath, but this is usually slow and shallow. She describes her symptoms as being stuck between awake and asleep.  She is fully aware of her surroundings during these episodes but cannot move her arms or speak, which she finds quite alarming.  She tells me once she is up and moving, she is fine. She expresses concerns that it might be a sleep disorder, like getting her symptoms to sleep paralysis, although she notes that it occurs every night, which is unusual for such conditions.  Corrie Dandy mentions that she does not feel unusually tired during the day and does not typically take naps.  She has tried various remedies on her own to address her symptoms but has not found relief.  She denies any routines or intake of substances like vitamins or tea before bedtime that could be contributing to her condition.  Corrie Dandy is open to undergoing a sleep study and possibly consulting with a neurologist to explore symptoms further and gain more insight into her  condition.    Past medical history, Surgical history, Family history not pertinant except as noted below, Social history, Allergies, and medications have been entered into the medical record, reviewed, and corrections made.   Review of Systems:  All review of systems negative except what is listed in the HPI  Objective:    Alert and oriented x 4 Speaking in clear sentences with no shortness of breath. No distress.  Impression and Recommendations:    Problem List Items Addressed This Visit     Sleep concern - Primary    Symptoms of inability to move upon awakening every morning, accompanied by a sense of heaviness and shallow breathing. The episodes occur every morning and can last for several minutes before resolving on their own. 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  Plan: - Referral for sleep evaluation - Neurology referral - Practice good sleep hygiene       orders and follow up as documented in EMR I discussed the assessment and treatment plan with the patient. The patient was provided an opportunity to ask questions and all were answered. The patient agreed with the plan and demonstrated an understanding of the instructions.   The patient was advised to call back or seek an in-person evaluation if the symptoms worsen or if the condition fails to improve as anticipated.  Follow-Up: after labs and/or imaging, consults,  etc. have been completed  I provided 22  minutes of non-face-to-face interaction with this non face-to-face encounter including intake, same-day documentation, and chart review.   Tollie Eth, NP , DNP, AGNP-c  [facility]

## 2023-04-28 ENCOUNTER — Other Ambulatory Visit: Payer: Self-pay | Admitting: Family Medicine

## 2023-04-28 DIAGNOSIS — J309 Allergic rhinitis, unspecified: Secondary | ICD-10-CM

## 2023-05-25 ENCOUNTER — Other Ambulatory Visit: Payer: Self-pay

## 2023-05-26 ENCOUNTER — Telehealth: Payer: Self-pay | Admitting: Internal Medicine

## 2023-05-26 ENCOUNTER — Other Ambulatory Visit (HOSPITAL_COMMUNITY): Payer: Self-pay

## 2023-05-26 NOTE — Telephone Encounter (Signed)
Called and left message for pt to call me back   Physical benefits through [facility] long specialty pharmacy is $200 copay. Pt is due after August 1st. We will have to file insurance for injection fee and whatever they don't pay she would be responsible. Wanted to see if patient wanted this done.

## 2023-05-29 ENCOUNTER — Other Ambulatory Visit (HOSPITAL_COMMUNITY): Payer: Self-pay

## 2023-06-01 ENCOUNTER — Other Ambulatory Visit (HOSPITAL_COMMUNITY): Payer: Self-pay

## 2023-06-01 ENCOUNTER — Encounter (HOSPITAL_COMMUNITY): Payer: Self-pay

## 2023-06-04 ENCOUNTER — Ambulatory Visit: Payer: PPO | Admitting: Family Medicine

## 2023-06-04 ENCOUNTER — Encounter: Payer: Self-pay | Admitting: Family Medicine

## 2023-06-04 VITALS — BP 118/72 | HR 64 | Ht 63.0 in | Wt 112.0 lb

## 2023-06-04 DIAGNOSIS — R6884 Jaw pain: Secondary | ICD-10-CM

## 2023-06-04 MED ORDER — METHOCARBAMOL 500 MG PO TABS: 500.0000 mg | ORAL_TABLET | Freq: Three times a day (TID) | ORAL | 0 refills | Status: DC | PRN

## 2023-06-04 MED ORDER — GABAPENTIN 100 MG PO CAPS
ORAL_CAPSULE | ORAL | 0 refills | Status: DC
Start: 2023-06-04 — End: 2023-09-25

## 2023-06-04 NOTE — Progress Notes (Signed)
Chief Complaint  Patient presents with   Pain in temple    Started Monday night, piercing pain (sharp) every couple of minutes in her right temple that moves down to her cheek area (upper). Saw her massage therapist yesterday and it is somewhat better but it has moved down to her cheek and spread some-no longer in her temple.    7/15 she started with sharp stabbing pains at the R temple, moved down to jer cheek, jaw, and back up to temple. Currently her discomfort starts at her jaw, radiates up towards the ear, and pain is a little more intense.  Pain is sharp and intense, occurs every couple of minutes.  She tried aleve and gabapentin (had for hip pain, no longer needed it, tried 100 mg twice a day for 2 days, didn't help). She took Aleve x 4 doses, none today. Denies any contraindications to NSAIDs, no ulcers, kidney disease, bleeding issues.  She has been walking mid-day (in the heat). Trying to watch her posture carefully (holding neck straight). The pain started the evening after a long walk.  Neck felt a little tight. Yesterday she got a massage (R neck was very tight, and her jaw).  It helped both the neck pain and her facial/jaw pain, and the sharp pains stopped for a few hours. Today, it recurred while at her [employer] class, more local at TMJ/cheek and ear on the right side.  Dentist has told her that she grinds her teeth.  Doesn't have a nightguard. No prior h/o trigeminal neuralgia. Isn't painful to touch.  PMH, PSH, SH reviewed  Outpatient Encounter Medications as of 06/04/2023  Medication Sig Note   budesonide-formoterol (SYMBICORT) 80-4.5 MCG/ACT inhaler Inhale 2 puffs into the lungs 2 (two) times daily as needed (asthma). 06/04/2023: Uses 1 puff daily   Cholecalciferol (VITAMIN D) 50 MCG (2000 UT) tablet Take 2,000 Units by mouth daily.    cyanocobalamin (VITAMIN B12) 1000 MCG tablet Take 1,000 mcg by mouth daily.    denosumab (PROLIA) 60 MG/ML SOSY injection Inject into the  skin subcutaneously every 6 months    gabapentin (NEURONTIN) 100 MG capsule Take 100 mg by mouth as needed. 06/04/2023: Took on yesterday (old rx)   ibuprofen (ADVIL) 200 MG tablet Take 200 mg by mouth every 6 (six) hours as needed. 06/04/2023: Took one yesterday am and pm   levocetirizine (XYZAL) 5 MG tablet TAKE 1 TABLET(5 MG) BY MOUTH EVERY EVENING    Probiotic Product (PRO-BIOTIC BLEND PO) Take 1 capsule by mouth 2 (two) times a week. 06/04/2023: Align   sertraline (ZOLOFT) 100 MG tablet Take 100 mg by mouth daily.    No facility-administered encounter medications on file as of 06/04/2023.   Allergies  Allergen Reactions   Bee Venom Anaphylaxis and Swelling   Shellfish Allergy Anaphylaxis and Hives   Shellfish-Derived Products Anaphylaxis   Pholcodine Other (See Comments)   Tetracycline Other (See Comments), Nausea And Vomiting, Nausea Only and Rash    Unsure about this med 08/28/15   Codeine Nausea And Vomiting   Misc. Sulfonamide Containing Compounds Nausea And Vomiting   Sulfonamide Derivatives Nausea And Vomiting   ROS:  No f/c, dizziness, chest pain or shortness of breath. No n/v/d. No URI symptoms.  Some mild congestion and watery eyes, chronic. No bleeding, bruising. Moods are good.   PHYSICAL EXAM:  BP 118/72   Pulse 64   Ht 5\' 3"  (1.6 m)   Wt 112 lb (50.8 kg)   BMI 19.84 kg/m  Well-appearing, pleasant female.  She jumps periodically due to sharp pains--noted just a few times during the visit. She reported feeling it often, but trying to control it. She doesn't appear to be in any discomfort or distress between episodes. HEENT: conjunctiva and sclera are clear, EOMI. Fundi benign. L eye watering Nasal mucosa--mild edema with clear mucus on the right. Sinuses nontender. TM's and EAC's normal.  OP clear Tender at R masseter.  TMJ nontender, no clicking/popping. Neck: Spine nontender. Area of tenderness at neck R upper neck, near base of skull.  There is no  lymphadenopathy or palpable spasm in the neck muscles. Heart: regular rate and rhythm Lungs: clear bilaterally Extremities: no edema Neuro: alert and oriented, cranial nerves intact. Normal gait Psych: normal mood, affect, hygiene and grooming   ASSESSMENT/PLAN:  Jaw pain - Ddx muscle spasm related to grinding/clenching, vs trigeminal neuralgia. Reviewed dx/treatments, risks/SE of meds. f/u next week if not better - Plan: methocarbamol (ROBAXIN) 500 MG tablet, gabapentin (NEURONTIN) 100 MG capsule  Discussed gabapentin as a second-line agent for TGN, but she has it at home.  Prefers to start with this plan. History seems c/w TGN except for not being tender/worse with palpation, and it does bother her and awaken her at night. If not improving with these measures, to f/u next week for further discussion (carbamezipine if seems more c/w TGN). The fact that she improved with massage also speaks against TGN.  I spent 32 minutes dedicated to the care of this patient, including pre-visit review of records, face to face time, post-visit ordering of testing and documentation.   Your pain is either due to Trigeminal neuralgia or TMJ/muscular issues related to grinding/clenching.  Take 2 Aleve twice daily with food.  If it bothers your stomach, cut back to just 1 pill twice daily. Try ice vs heat--use whichever works the best.  I would continue with gabapentin, as this might help if it is trigeminal neuralgia. Take 1 in the morning and 2 at bedtime; increase in a few days, if needed to 3 at bedtime, and up to 1 twice during the day, if needed (max to take after being on it for a week would be a total of 5 pills, 1 morning, 1 mid-day, 3 at bedtime). We can further titrate up, vs change therapy, if zero improvement on this regimen.  In case there is a muscular component, we will also use a non-sedating muscle relaxant. Use the methocarbamol as needed.  Return next week if not better. There are  different recommended medications to treat trigeminal neuralgia (the gabapentin should help, but isn't first line). We can switch to that if not improving with current treatment.

## 2023-06-04 NOTE — Patient Instructions (Signed)
Your pain is either due to Trigeminal neuralgia or TMJ/muscular issues related to grinding/clenching.  Take 2 Aleve twice daily with food.  If it bothers your stomach, cut back to just 1 pill twice daily. Try ice vs heat--use whichever works the best.  I would continue with gabapentin, as this might help if it is trigeminal neuralgia. Take 1 in the morning and 2 at bedtime; increase in a few days, if needed to 3 at bedtime, and up to 1 twice during the day, if needed (max to take after being on it for a week would be a total of 5 pills, 1 morning, 1 mid-day, 3 at bedtime). We can further titrate up, vs change therapy, if zero improvement on this regimen.  In case there is a muscular component, we will also use a non-sedating muscle relaxant. Use the methocarbamol as needed.  Return next week if not better. There are different recommended medications to treat trigeminal neuralgia (the gabapentin should help, but isn't first line). We can switch to that if not improving with current treatment.

## 2023-06-05 NOTE — Telephone Encounter (Signed)
Left message for pt to call me back 

## 2023-06-15 NOTE — Telephone Encounter (Signed)
Left message for pt to call back  °

## 2023-06-16 NOTE — Telephone Encounter (Signed)
Spoke to patient and she can not afford Prolia for now and it might be later on this fall before she can possible restart the prolia, in the meantime she wants to know if this is going to hurt her by not having  the prolia for her 6 months.

## 2023-06-17 NOTE — Telephone Encounter (Signed)
Left message for pt to call me back 

## 2023-06-18 NOTE — Telephone Encounter (Signed)
Left message for pt to call me back 

## 2023-06-19 MED ORDER — ALENDRONATE SODIUM 70 MG PO TABS: 70.0000 mg | ORAL_TABLET | ORAL | 5 refills | Status: AC

## 2023-06-19 NOTE — Telephone Encounter (Signed)
Left message for pt to call me back 

## 2023-06-19 NOTE — Telephone Encounter (Signed)
Pt is agreeable to fosamax so I have sent this in for 6 months until she can start back on prolia

## 2023-07-29 ENCOUNTER — Other Ambulatory Visit: Payer: Self-pay

## 2023-08-08 ENCOUNTER — Encounter (HOSPITAL_COMMUNITY): Payer: Self-pay

## 2023-08-17 ENCOUNTER — Other Ambulatory Visit: Payer: Self-pay | Admitting: Family Medicine

## 2023-08-17 DIAGNOSIS — R6884 Jaw pain: Secondary | ICD-10-CM

## 2023-08-17 NOTE — Telephone Encounter (Signed)
Patient advised.

## 2023-08-17 NOTE — Telephone Encounter (Signed)
She was previously given this for her hip from Dr. Terrilee Files.  She wasn't having any hip pain, just jaw pain, when I prescribed this in July. She should follow-up with Dr. Katrinka Blazing.  She also needs to schedule her annual visit with SaraBeth (her PCP)

## 2023-08-17 NOTE — Telephone Encounter (Signed)
Is this okay to refill? I called patient and she did request. When I asked patient how many she takes daily she stated 1 and when she works she takes 2. I asked if she takes for jaw pain, she said no she takes for hip pain.

## 2023-09-24 DIAGNOSIS — H3540 Unspecified peripheral retinal degeneration: Secondary | ICD-10-CM | POA: Diagnosis not present

## 2023-09-24 NOTE — Progress Notes (Signed)
[name] [address] [place] [address] Phone: [phone] Subjective:   Deborah Bray, am serving as a scribe for Dr. Antoine Primas.  I'm seeing this patient by the request  of:  Early, [name], NP  CC: Back pain and hip pain follow-up  [id number]  12/05/2021 Patient on ultrasound does have what appears to be more than body fluid noted.  We discussed with patient about different treatment options.  We discussed potentially trying a injection but at this moment I think bracing and exercises will do relatively well.  Continue on the gabapentin that is helped her back up significantly.  Follow-up again in 6 to 8 weeks if not improved consider formal occupational therapy or injection     Updated 09/25/2023 Deborah Bray is a 72 y.o. female coming in with complaint of pain. Here for gabapentin refill. Patient has been doing well and is not taking gabapentin on a regular basis.        Past Medical History:  Diagnosis Date   Asthma    allergy induced   CIN I (cervical intraepithelial neoplasia I)    CIN III (cervical intraepithelial neoplasia III)    Depression    Elevated cholesterol    Encounter for screening for lung cancer 12/08/2017   HEADACHE 03/07/2008   Qualifier: Diagnosis of  By: Linna Darner, CMA, Cindy     Nasal fracture    Osteoporosis    Past Surgical History:  Procedure Laterality Date   BACK SURGERY     CERVICAL BIOPSY  W/ LOOP ELECTRODE EXCISION     CLOSED REDUCTION NASAL FRACTURE N/A 09/19/2016   Procedure: CLOSED REDUCTION NASAL FRACTURE;  Surgeon: Drema Halon, MD;  Location: [facility];  Service: ENT;  Laterality: N/A;   COLPOSCOPY     GYNECOLOGIC CRYOSURGERY     KNEE SURGERY     Arthroscopic X 3   NOSE SURGERY     Squamous cell excised     Lip   TURBINATE REDUCTION Bilateral 09/19/2016   Procedure: TURBINATE REDUCTION;  Surgeon: Drema Halon, MD;  Location: [facility];  Service: ENT;  Laterality: Bilateral;   vocal cord surg     Social History   Socioeconomic History   Marital status: Single    Spouse name: Not on file   Number of children: Not on file   Years of education: Not on file   Highest education level: Not on file  Occupational History   Not on file  Tobacco Use   Smoking status: Former    Current packs/day: 0.00    Average packs/day: 4.0 packs/day for 15.0 years (60.0 ttl pk-yrs)    Types: Cigarettes    Start date: 08/27/1984    Quit date: 08/28/1999    Years since quitting: 24.0   Smokeless tobacco: Never  Vaping Use   Vaping status: Never Used  Substance and Sexual Activity   Alcohol use: Yes    Alcohol/week: 14.0 standard drinks of alcohol    Types: 14 Glasses of wine per week    Comment: 2 glasses of wine daily   Drug use: Not Currently    Comment: in college- marijuana, mushrooms    Sexual activity: Not Currently    Birth control/protection: Post-menopausal    Comment: 1st intercourse 72 yo-More than 5 partners  Other Topics Concern   Not on file  Social History Narrative   Not on file   Social Determinants of  Health   Financial Resource Strain: Not on file  Food Insecurity: Not on file  Transportation Needs: Not on file  Physical Activity: Not on file  Stress: Not on file  Social Connections: Not on file   Allergies  Allergen Reactions   Bee Venom Anaphylaxis and Swelling   Shellfish Allergy Anaphylaxis and Hives   Shellfish-Derived Products Anaphylaxis   Pholcodine Other (See Comments)   Tetracycline Other (See Comments), Nausea And Vomiting, Nausea Only and Rash    Unsure about this med 08/28/15   Codeine Nausea And Vomiting   Misc. Sulfonamide Containing Compounds Nausea And Vomiting   Sulfonamide Derivatives Nausea And Vomiting   Family History  Problem Relation Age of Onset   Hypertension Mother    Diabetes Mother    Allergies Mother    Bowel Disease Mother    Glaucoma Mother    Heart  attack Mother 72   Other Sister        Brain tumor-benign   Cancer Sister        BRAIN TUMOR   Cancer Brother        Throat cancer   Parkinson's disease Father     Current Outpatient Medications (Endocrine & Metabolic):    alendronate (FOSAMAX) 70 MG tablet, Take 1 tablet (70 mg total) by mouth every 7 (seven) days. Take with a full glass of water on an empty stomach.   denosumab (PROLIA) 60 MG/ML SOSY injection, Inject into the skin subcutaneously every 6 months   Current Outpatient Medications (Respiratory):    budesonide-formoterol (SYMBICORT) 80-4.5 MCG/ACT inhaler, Inhale 2 puffs into the lungs 2 (two) times daily as needed (asthma).   levocetirizine (XYZAL) 5 MG tablet, TAKE 1 TABLET(5 MG) BY MOUTH EVERY EVENING  Current Outpatient Medications (Analgesics):    ibuprofen (ADVIL) 200 MG tablet, Take 200 mg by mouth every 6 (six) hours as needed.  Current Outpatient Medications (Hematological):    cyanocobalamin (VITAMIN B12) 1000 MCG tablet, Take 1,000 mcg by mouth daily.  Current Outpatient Medications (Other):    gabapentin (NEURONTIN) 100 MG capsule, Take 2 capsules (200 mg total) by mouth 2 (two) times daily.   Cholecalciferol (VITAMIN D) 50 MCG (2000 UT) tablet, Take 2,000 Units by mouth daily.   methocarbamol (ROBAXIN) 500 MG tablet, Take 1-2 tablets (500-1,000 mg total) by mouth every 8 (eight) hours as needed for muscle spasms.   Probiotic Product (PRO-BIOTIC BLEND PO), Take 1 capsule by mouth 2 (two) times a week.   sertraline (ZOLOFT) 100 MG tablet, Take 100 mg by mouth daily.   Objective  Blood pressure 124/84, pulse 80, height 5\' 3"  (1.6 m), weight 103 lb (46.7 kg), SpO2 98%.   General: No apparent distress alert and oriented x3 mood and affect normal, dressed appropriately.  HEENT: Pupils equal, extraocular movements intact  Respiratory: Patient's speak in full sentences and does not appear short of breath  Cardiovascular: No lower extremity edema, non tender,  no erythema  Low back does have some loss of lordosis but is sitting comfortably.  He still has maybe some mild antalgic gait but very minimal.    Impression and Recommendations:     The above documentation has been reviewed and is accurate and complete Judi Saa, DO

## 2023-09-25 ENCOUNTER — Encounter: Payer: Self-pay | Admitting: Family Medicine

## 2023-09-25 ENCOUNTER — Ambulatory Visit: Payer: PPO | Admitting: Family Medicine

## 2023-09-25 VITALS — BP 124/84 | HR 80 | Ht 63.0 in | Wt 103.0 lb

## 2023-09-25 DIAGNOSIS — M5136 Other intervertebral disc degeneration, lumbar region with discogenic back pain only: Secondary | ICD-10-CM

## 2023-09-25 MED ORDER — GABAPENTIN 100 MG PO CAPS
200.0000 mg | ORAL_CAPSULE | Freq: Two times a day (BID) | ORAL | 0 refills | Status: DC
Start: 2023-09-25 — End: 2024-06-21

## 2023-09-25 NOTE — Assessment & Plan Note (Signed)
Patient has been doing significantly better at this time.  Nothing that is stopping her from activity.  Patient is having increased activity including doing Pilates.  Patient will continue to be active.  Refilled gabapentin to 200 mg twice a day.  Can refill for 1 year.  Patient can follow-up with me as needed

## 2023-09-25 NOTE — Patient Instructions (Addendum)
Gabapentin refilled You know where we are if you need me Happy Early [place] Ouch

## 2023-12-01 ENCOUNTER — Other Ambulatory Visit: Payer: PPO | Admitting: Family Medicine

## 2023-12-01 DIAGNOSIS — J301 Allergic rhinitis due to pollen: Secondary | ICD-10-CM | POA: Diagnosis not present

## 2023-12-01 DIAGNOSIS — R1030 Lower abdominal pain, unspecified: Secondary | ICD-10-CM

## 2023-12-01 DIAGNOSIS — J305 Allergic rhinitis due to food: Secondary | ICD-10-CM | POA: Diagnosis not present

## 2023-12-01 DIAGNOSIS — J3089 Other allergic rhinitis: Secondary | ICD-10-CM | POA: Diagnosis not present

## 2023-12-01 DIAGNOSIS — J4540 Moderate persistent asthma, uncomplicated: Secondary | ICD-10-CM | POA: Diagnosis not present

## 2023-12-01 NOTE — Telephone Encounter (Signed)
 Last apt 03/26/23 the script was d/c and was given to her by Dr. Lynelle Doctor over a year ago.

## 2023-12-14 DIAGNOSIS — L570 Actinic keratosis: Secondary | ICD-10-CM | POA: Diagnosis not present

## 2024-03-21 ENCOUNTER — Ambulatory Visit: Payer: Self-pay

## 2024-03-21 ENCOUNTER — Telehealth: Payer: Self-pay

## 2024-03-21 NOTE — Telephone Encounter (Signed)
 Copied from CRM [id number]. Topic: General - Call Back - No Documentation >> Mar 21, 2024  9:55 AM Hamp Levine R wrote: Reason for CRM: Patient states she received a call from Melissa about possibly being able to schedule her an appointment today instead of Wednesday. Attempted to contact office and was placed on hold for over 7 minutes. Patient said had number from voicemail she would try to call as she didn't want to continue holding.   Patient can be reached at [phone]

## 2024-03-21 NOTE — Telephone Encounter (Signed)
  Chief Complaint: small bumps Symptoms: itching, runny nose, wheezing, cough Frequency: 1 week Pertinent Negatives: Patient denies redness or blisters Disposition: [] ED /[] Urgent Care (no appt availability in office) / [x] Appointment(In office/virtual)/ []  [facility]/ [] Home Care/ [] Refused Recommended Disposition /[] [facility]/ []  Follow-up with PCP Additional Notes:   Copied from CRM [id number]. Topic: Clinical - Red Word Triage >> Mar 21, 2024  9:24 AM Marlan Silva wrote: Red Word that prompted transfer to Nurse Triage: Patient called in stating that she has rash on her neck for about a week, its spreading and very itchy and red. Patient states she is also having allergy symptoms, coughing, mild wheezing, sneezing and chest congestion. There are no same day appointments available currently for her PCPs clinic. Reason for Disposition  Localized rash present > 7 days  Answer Assessment - Initial Assessment Questions 1. APPEARANCE of RASH: "Describe the rash."      Small bumps 2. LOCATION: "Where is the rash located?"      Neck, top of left shoulder   3. NUMBER: "How many spots are there?"      Diffuse  4. SIZE: "How big are the spots?" (Inches, centimeters or compare to size of a coin)      Small  5. ONSET: "When did the rash start?"      1 week  6. ITCHING: "Does the rash itch?" If Yes, ask: "How bad is the itch?"  (Scale 0-10; or none, mild, moderate, severe)     Yes severe  7. PAIN: "Does the rash hurt?" If Yes, ask: "How bad is the pain?"  (Scale 0-10; or none, mild, moderate, severe)    - NONE (0): no pain    - MILD (1-3): doesn't interfere with normal activities     - MODERATE (4-7): interferes with normal activities or awakens from sleep     - SEVERE (8-10): excruciating pain, unable to do any normal activities     no 8. OTHER SYMPTOMS: "Do you have any other symptoms?" (e.g., fever)     Runny nose, allergies, whhezy chest cough  Protocols used: Rash or  Redness - Localized-A-AH

## 2024-03-22 ENCOUNTER — Ambulatory Visit (INDEPENDENT_AMBULATORY_CARE_PROVIDER_SITE_OTHER): Admitting: Nurse Practitioner

## 2024-03-22 ENCOUNTER — Encounter: Payer: Self-pay | Admitting: Nurse Practitioner

## 2024-03-22 VITALS — BP 130/80 | HR 93 | Wt 111.8 lb

## 2024-03-22 DIAGNOSIS — B9689 Other specified bacterial agents as the cause of diseases classified elsewhere: Secondary | ICD-10-CM | POA: Diagnosis not present

## 2024-03-22 DIAGNOSIS — Z7184 Encounter for health counseling related to travel: Secondary | ICD-10-CM | POA: Diagnosis not present

## 2024-03-22 DIAGNOSIS — R21 Rash and other nonspecific skin eruption: Secondary | ICD-10-CM

## 2024-03-22 DIAGNOSIS — J988 Other specified respiratory disorders: Secondary | ICD-10-CM | POA: Insufficient documentation

## 2024-03-22 DIAGNOSIS — J0140 Acute pansinusitis, unspecified: Secondary | ICD-10-CM | POA: Diagnosis not present

## 2024-03-22 MED ORDER — PREDNISONE 20 MG PO TABS: ORAL_TABLET | ORAL | 0 refills | Status: AC

## 2024-03-22 MED ORDER — PROMETHAZINE-DM 6.25-15 MG/5ML PO SYRP: 5.0000 mL | ORAL_SOLUTION | Freq: Four times a day (QID) | ORAL | 0 refills | Status: AC | PRN

## 2024-03-22 MED ORDER — BENZONATATE 200 MG PO CAPS: 200.0000 mg | ORAL_CAPSULE | Freq: Two times a day (BID) | ORAL | 0 refills | Status: AC | PRN

## 2024-03-22 MED ORDER — AMOXICILLIN-POT CLAVULANATE 875-125 MG PO TABS: 1.0000 | ORAL_TABLET | Freq: Two times a day (BID) | ORAL | 0 refills | Status: AC

## 2024-03-22 NOTE — Progress Notes (Unsigned)
 Annella Kief, DNP, AGNP-c [facility] [address] [place], [place] [address] [phone]   ACUTE VISIT- ESTABLISHED PATIENT  Blood pressure 130/80, pulse 93, weight 111 lb 12.8 oz (50.7 kg), SpO2 93%.  Subjective:  HPI Deborah Bray is a 73 y.o. female presents to day for evaluation of acute concern(s).   History of Present Illness Deborah Bray "Deborah Bray" is a 73 year old female with asthma who presents with a persistent cough and rash.  She has been experiencing a persistent cough that began on Sunday, following a week or two of severe allergies. The cough is described as annoying and has not improved with the use of her inhaler, although the inhaler helps her breathe. No fevers are present, and her symptoms have been noticeable enough that her boss almost sent her home.  Her allergies have been the worst she has ever experienced, slightly improving but initially leading to her current respiratory symptoms. She has been using Benadryl recently due to the severity of the allergy season, although she usually takes levocetirizine, which she feels is not as effective currently.  She reports a rash on the left side of her neck, initially thought to be poison ivy due to her dog getting into it. However, the rash has not blistered like poison ivy typically does. It started in a small area and spread across her shoulder, described as itchy but not burning. She has been using cortisone cream to alleviate the itch.  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 She works in [employer] and believes something in the air when the ovens heat up exacerbates her symptoms. She has tried wearing a mask but did not notice a significant difference.  She is allergic to sulfa drugs and tetracycline and has previously taken prednisone  for her asthma. She is planning a trip to [place] in August.   ROS negative except  for what is listed in HPI. History, Medications, Surgery, SDOH, and Family History reviewed and updated as appropriate.  Objective:  Physical Exam Vitals and nursing note reviewed.  Constitutional:      Appearance: She is ill-appearing.  HENT:     Ears:     Comments: Bilateral clear effusion    Nose: Congestion and rhinorrhea present.     Mouth/Throat:     Mouth: Mucous membranes are moist.     Pharynx: Posterior oropharyngeal erythema present.  Eyes:     Conjunctiva/sclera: Conjunctivae normal.  Neck:     Vascular: No carotid bruit.  Cardiovascular:     Rate and Rhythm: Normal rate and regular rhythm.     Pulses: Normal pulses.     Heart sounds: Normal heart sounds.  Pulmonary:     Effort: Pulmonary effort is normal.     Breath sounds: Wheezing and rhonchi present.  Abdominal:     General: Bowel sounds are normal.  Skin:    General: Skin is warm and dry.     Capillary Refill: Capillary refill takes less than 2 seconds.  Neurological:     Mental Status: She is oriented to person, place, and time.  Psychiatric:        Behavior: Behavior normal.    Left neck      Assessment & Plan:   Problem List Items Addressed This Visit     Bacterial respiratory infection - Primary   Acute wheezing and cough likely secondary to a bacterial infection, given the duration  of symptoms and absence of fever. Severe allergies contribute to respiratory symptoms. Wheezing is present on the right side. Inhaler use provides some relief for breathing but not for cough. - Prescribe Augmentin for possible bacterial infection. - Prescribe prednisone  taper to reduce inflammation and wheezing. - Prescribe Tussilon pearls for daytime cough relief. - Prescribe promethazine with dextromethorphan for nighttime cough relief.      Relevant Medications   benzonatate  (TESSALON ) 200 MG capsule   promethazine-dextromethorphan (PROMETHAZINE-DM) 6.25-15 MG/5ML syrup   predniSONE  (DELTASONE ) 20 MG tablet    amoxicillin -clavulanate (AUGMENTIN) 875-125 MG tablet   Rash   Etiology unclear. Starting on prednisone , which should help. Consider dermatology evaluation if this does not clear.       Travel advice encounter   Upcoming trip to [place] with reported need for yellow fever vaccine. Health department requires approval letter from PCP. Review of yellow fever vaccine shows that significant risks are associated with administration in 60 and older individuals. Information sent to patient through MyChart detailing the risks and complications that could occur. Ultimately, the decision for the vaccine is hers as long as she understands the risks involved and is willing to take these risks which include serious illness and death. She would not be eligible for the vaccine for at least 30 days after taking the steroid to ensure her immune system is well enough to handle this. Will follow.       Acute non-recurrent pansinusitis   Relevant Medications   benzonatate  (TESSALON ) 200 MG capsule   promethazine-dextromethorphan (PROMETHAZINE-DM) 6.25-15 MG/5ML syrup   predniSONE  (DELTASONE ) 20 MG tablet   amoxicillin -clavulanate (AUGMENTIN) 875-125 MG tablet      Annella Kief, DNP, AGNP-c

## 2024-03-22 NOTE — Patient Instructions (Signed)
 I will send the letter for you in MyChart once I speak with Dr. Robina Chol.

## 2024-03-23 ENCOUNTER — Ambulatory Visit: Admitting: Family Medicine

## 2024-03-23 DIAGNOSIS — R21 Rash and other nonspecific skin eruption: Secondary | ICD-10-CM | POA: Insufficient documentation

## 2024-03-23 DIAGNOSIS — Z7184 Encounter for health counseling related to travel: Secondary | ICD-10-CM | POA: Insufficient documentation

## 2024-03-23 NOTE — Assessment & Plan Note (Signed)
 Upcoming trip to [place] with reported need for yellow fever vaccine. Health department requires approval letter from PCP. Review of yellow fever vaccine shows that significant risks are associated with administration in 60 and older individuals. Information sent to patient through MyChart detailing the risks and complications that could occur. Ultimately, the decision for the vaccine is hers as long as she understands the risks involved and is willing to take these risks which include serious illness and death. She would not be eligible for the vaccine for at least 30 days after taking the steroid to ensure her immune system is well enough to handle this. Will follow.

## 2024-03-23 NOTE — Assessment & Plan Note (Signed)
 Etiology unclear. Starting on prednisone , which should help. Consider dermatology evaluation if this does not clear.

## 2024-03-23 NOTE — Assessment & Plan Note (Signed)
 Acute wheezing and cough likely secondary to a bacterial infection, given the duration of symptoms and absence of fever. Severe allergies contribute to respiratory symptoms. Wheezing is present on the right side. Inhaler use provides some relief for breathing but not for cough. - Prescribe Augmentin for possible bacterial infection. - Prescribe prednisone  taper to reduce inflammation and wheezing. - Prescribe Tussilon pearls for daytime cough relief. - Prescribe promethazine with dextromethorphan for nighttime cough relief.

## 2024-03-29 ENCOUNTER — Encounter: Payer: Self-pay | Admitting: Nurse Practitioner

## 2024-03-30 DIAGNOSIS — J4540 Moderate persistent asthma, uncomplicated: Secondary | ICD-10-CM | POA: Diagnosis not present

## 2024-03-30 DIAGNOSIS — J305 Allergic rhinitis due to food: Secondary | ICD-10-CM | POA: Diagnosis not present

## 2024-03-30 DIAGNOSIS — J301 Allergic rhinitis due to pollen: Secondary | ICD-10-CM | POA: Diagnosis not present

## 2024-03-30 DIAGNOSIS — J3089 Other allergic rhinitis: Secondary | ICD-10-CM | POA: Diagnosis not present

## 2024-04-05 ENCOUNTER — Ambulatory Visit: Admitting: Family Medicine

## 2024-04-13 DIAGNOSIS — D485 Neoplasm of uncertain behavior of skin: Secondary | ICD-10-CM | POA: Diagnosis not present

## 2024-04-13 DIAGNOSIS — C44722 Squamous cell carcinoma of skin of right lower limb, including hip: Secondary | ICD-10-CM | POA: Diagnosis not present

## 2024-04-14 DIAGNOSIS — J4540 Moderate persistent asthma, uncomplicated: Secondary | ICD-10-CM | POA: Diagnosis not present

## 2024-04-14 DIAGNOSIS — J305 Allergic rhinitis due to food: Secondary | ICD-10-CM | POA: Diagnosis not present

## 2024-04-14 DIAGNOSIS — J301 Allergic rhinitis due to pollen: Secondary | ICD-10-CM | POA: Diagnosis not present

## 2024-04-14 DIAGNOSIS — J3089 Other allergic rhinitis: Secondary | ICD-10-CM | POA: Diagnosis not present

## 2024-05-10 DIAGNOSIS — C44722 Squamous cell carcinoma of skin of right lower limb, including hip: Secondary | ICD-10-CM | POA: Diagnosis not present

## 2024-05-24 DIAGNOSIS — D485 Neoplasm of uncertain behavior of skin: Secondary | ICD-10-CM | POA: Diagnosis not present

## 2024-05-24 DIAGNOSIS — Z4802 Encounter for removal of sutures: Secondary | ICD-10-CM | POA: Diagnosis not present

## 2024-05-24 DIAGNOSIS — C44612 Basal cell carcinoma of skin of right upper limb, including shoulder: Secondary | ICD-10-CM | POA: Diagnosis not present

## 2024-05-30 DIAGNOSIS — C44612 Basal cell carcinoma of skin of right upper limb, including shoulder: Secondary | ICD-10-CM | POA: Diagnosis not present

## 2024-06-20 ENCOUNTER — Telehealth: Payer: Self-pay | Admitting: Family Medicine

## 2024-06-20 NOTE — Telephone Encounter (Signed)
 Patient called asking if Dr Claudene would be willing to refill her gabapentin  (NEURONTIN ) 100 MG capsule.  She said that this is not something that she uses regularly but she will be going on a 3 week trip to [place] and would like to have it on hand with all the walking and riding she will be doing.  Please advise.

## 2024-06-21 ENCOUNTER — Telehealth: Payer: Self-pay | Admitting: Nurse Practitioner

## 2024-06-21 ENCOUNTER — Telehealth: Payer: Self-pay

## 2024-06-21 ENCOUNTER — Other Ambulatory Visit: Payer: Self-pay

## 2024-06-21 DIAGNOSIS — Z792 Long term (current) use of antibiotics: Secondary | ICD-10-CM

## 2024-06-21 MED ORDER — CIPROFLOXACIN HCL 500 MG PO TABS: 500.0000 mg | ORAL_TABLET | Freq: Two times a day (BID) | ORAL | 0 refills | Status: AC

## 2024-06-21 MED ORDER — GABAPENTIN 100 MG PO CAPS: 200.0000 mg | ORAL_CAPSULE | Freq: Two times a day (BID) | ORAL | 0 refills | Status: AC

## 2024-06-21 NOTE — Telephone Encounter (Signed)
 Letter sent to pt. Information from previous MyChart message sent in letter.   Copied from CRM [id number]. Topic: General - Other >> Jun 21, 2024  1:29 PM Marissa P wrote: Reason for CRM: Patient will need a letter in regards to to the yellow fever vaccine she was recommended not to get

## 2024-06-21 NOTE — Telephone Encounter (Unsigned)
 Copied from CRM [id number]. Topic: Clinical - Medication Refill >> Jun 21, 2024  1:26 PM Marissa P wrote: Medication:  Cipro  (broad spectrum antibiotic just incase needed)  She will be going to [place]

## 2024-06-21 NOTE — Telephone Encounter (Signed)
 Pt requesting Cipro  prophylactically for upcoming trip to [place]. Please advise

## 2024-06-21 NOTE — Telephone Encounter (Signed)
 Prophylactic cipro  prescription sent for upcoming trip. Please let her know.

## 2024-06-28 NOTE — Progress Notes (Signed)
 Deborah Bray D.[facility] [address] [place] [address] Phone: [phone]   Assessment and Plan:     1. Strain of hip flexor, right, initial encounter -Chronic with exacerbation, initial sports medicine visit - Recurrence of right hip pain, though current presentation feels different to patient.  Presentation today most consistent with strain of sartorius based on TTP ASIS, pain with resisted hip flexion and external rotation.  Mildly TTP over greater trochanter, so greater trochanteric bursitis considered in differential - Start meloxicam  15 mg daily x2 weeks.  If still having pain after 2 weeks, complete 3rd-week of NSAID. May use remaining NSAID as needed once daily for pain control.  Do not to use additional over-the-counter NSAIDs (ibuprofen, naproxen, Advil, Aleve, etc.) while taking prescription NSAIDs.  May use Tylenol [phone] mg 2 to 3 times a day for breakthrough pain. - Start HEP for hip flexors  15 additional minutes spent for educating Therapeutic Home Exercise Program.  This included exercises focusing on stretching, strengthening, with focus on eccentric aspects.   Long term goals include an improvement in range of motion, strength, endurance as well as avoiding reinjury. Patient's frequency would include in 1-2 times a day, 3-5 times a week for a duration of 6-12 weeks. Proper technique shown and discussed handout in great detail with ATC.  All questions were discussed and answered.     Pertinent previous records reviewed include CT abdomen pelvis 2023 showing no significant degenerative changes in right hip joint  Follow Up: As needed if no improvement or worsening of symptoms.  Could follow-up next week before patient's trip to [place] if no improvement in symptoms with medication course, and we could consider CSI targeting sartorius versus greater trochanteric based on area of maximal TTP   Subjective:   I, Deborah Bray, am  serving as a Neurosurgeon for Doctor Deborah Bray  Chief Complaint: right hip pain   HPI:   06/29/2024 Patient is a 73 year old female with right hip pain. Patient states right hip pain for about 2 weeks. No MOI. Started bar class. Advil for the pain. No numbness or tingling. Pain radiates down the quad but she does have an area where the pain originates. Decreased ROM.  5-8 years of chronic hip pain that was alleviated with gabapentin  but notes this pain is different    Relevant Historical Information:   None pertinent   Additional pertinent review of systems negative.   Current Outpatient Medications:    meloxicam  (MOBIC ) 15 MG tablet, Take 1 tablet daily for 2 weeks.  If still in pain after 2 weeks, take 1 tablet daily for an additional 1 week., Disp: 30 tablet, Rfl: 0   alendronate  (FOSAMAX ) 70 MG tablet, Take 1 tablet (70 mg total) by mouth every 7 (seven) days. Take with a full glass of water on an empty stomach. (Patient not taking: Reported on 03/22/2024), Disp: 4 tablet, Rfl: 5   amoxicillin -clavulanate (AUGMENTIN ) 875-125 MG tablet, Take 1 tablet by mouth 2 (two) times daily. Take 1 tab every 12 hours. Take with food to avoid stomach upset. For infection., Disp: 14 tablet, Rfl: 0   benzonatate  (TESSALON ) 200 MG capsule, Take 1 capsule (200 mg total) by mouth 2 (two) times daily as needed for cough., Disp: 20 capsule, Rfl: 0   budesonide-formoterol (SYMBICORT) 80-4.5 MCG/ACT inhaler, Inhale 2 puffs into the lungs 2 (two) times daily as needed (asthma)., Disp: , Rfl:    Cholecalciferol (VITAMIN D ) 50 MCG (  2000 UT) tablet, Take 2,000 Units by mouth daily. (Patient not taking: Reported on 03/22/2024), Disp: , Rfl:    cyanocobalamin  (VITAMIN B12) 1000 MCG tablet, Take 1,000 mcg by mouth daily. (Patient not taking: Reported on 03/22/2024), Disp: , Rfl:    denosumab  (PROLIA ) 60 MG/ML SOSY injection, Inject into the skin subcutaneously every 6 months (Patient not taking: Reported on 03/22/2024), Disp:  1 mL, Rfl: 0   gabapentin  (NEURONTIN ) 100 MG capsule, Take 2 capsules (200 mg total) by mouth 2 (two) times daily., Disp: 180 capsule, Rfl: 0   ibuprofen (ADVIL) 200 MG tablet, Take 200 mg by mouth every 6 (six) hours as needed., Disp: , Rfl:    levocetirizine (XYZAL ) 5 MG tablet, TAKE 1 TABLET(5 MG) BY MOUTH EVERY EVENING, Disp: 30 tablet, Rfl: 5   predniSONE  (DELTASONE ) 20 MG tablet, Take 60mg  PO daily x 2 days, then40mg  PO daily x 2 days, then 20mg  PO daily x 3 days, Disp: 13 tablet, Rfl: 0   Probiotic Product (PRO-BIOTIC BLEND PO), Take 1 capsule by mouth 2 (two) times a week. (Patient not taking: Reported on 03/22/2024), Disp: , Rfl:    promethazine -dextromethorphan (PROMETHAZINE -DM) 6.25-15 MG/5ML syrup, Take 5 mLs by mouth 4 (four) times daily as needed for cough., Disp: 118 mL, Rfl: 0   sertraline (ZOLOFT) 100 MG tablet, Take 100 mg by mouth daily., Disp: , Rfl: 1   Objective:     Vitals:   06/29/24 1557  Pulse: 84  SpO2: 96%  Weight: 108 lb (49 kg)  Height: 5' 3 (1.6 m)      Body mass index is 19.13 kg/m.    Physical Exam:   General: awake, alert, and oriented no acute distress, nontoxic Skin: no suspicious lesions or rashes Neuro:sensation intact distally with no deficits, normal muscle tone, no atrophy, strength 5/5 in all tested lower ext groups Psych: normal mood and affect, speech clear   Right hip: No deformity, swelling or wasting ROM Flexion 90 with pain, ext 30, IR 45, ER 45 with pain TTP moderately ASIS, and hip flexors.  Mildly greater trochanter NTTP over the  , gluteal musculature, si joint, lumbar spine Negative log roll with FROM Negative FABER Negative FADIR Negative Piriformis test Positive right trendelenberg for anterior/lateral hip pain Gait normal    Electronically signed by:  Odis Bray D.[facility] 4:24 PM 06/29/24

## 2024-06-29 ENCOUNTER — Ambulatory Visit: Admitting: Sports Medicine

## 2024-06-29 VITALS — HR 84 | Ht 63.0 in | Wt 108.0 lb

## 2024-06-29 DIAGNOSIS — S76011A Strain of muscle, fascia and tendon of right hip, initial encounter: Secondary | ICD-10-CM | POA: Diagnosis not present

## 2024-06-29 MED ORDER — MELOXICAM 15 MG PO TABS: ORAL_TABLET | ORAL | 0 refills | Status: AC

## 2024-06-29 NOTE — Patient Instructions (Signed)
-   Start meloxicam  15 mg daily x2 weeks.  If still having pain after 2 weeks, complete 3rd-week of NSAID. May use remaining NSAID as needed once daily for pain control.  Do not to use additional over-the-counter NSAIDs (ibuprofen, naproxen, Advil, Aleve, etc.) while taking prescription NSAIDs.  May use Tylenol [phone] mg 2 to 3 times a day for breakthrough pain.  Hip HEP   As needed follow up if no improvement can call and ask to be worked in for injection

## 2024-08-01 ENCOUNTER — Ambulatory Visit
Admission: RE | Admit: 2024-08-01 | Discharge: 2024-08-01 | Disposition: A | Discharge status: Home / Self Care | Source: Ambulatory Visit | Attending: Family Medicine | Admitting: Family Medicine

## 2024-08-01 ENCOUNTER — Ambulatory Visit: Payer: Self-pay

## 2024-08-01 ENCOUNTER — Ambulatory Visit (INDEPENDENT_AMBULATORY_CARE_PROVIDER_SITE_OTHER): Admitting: Family Medicine

## 2024-08-01 VITALS — BP 120/78 | HR 69 | Wt 107.4 lb

## 2024-08-01 DIAGNOSIS — R197 Diarrhea, unspecified: Secondary | ICD-10-CM | POA: Diagnosis not present

## 2024-08-01 DIAGNOSIS — R109 Unspecified abdominal pain: Secondary | ICD-10-CM | POA: Diagnosis not present

## 2024-08-01 DIAGNOSIS — R1084 Generalized abdominal pain: Secondary | ICD-10-CM

## 2024-08-01 NOTE — Patient Instructions (Signed)
 Please head over to [facility] imaging for xrays

## 2024-08-01 NOTE — Telephone Encounter (Signed)
   Copied from CRM [id number]. Topic: Clinical - Red Word Triage >> Aug 01, 2024 10:11 AM Tinnie BROCKS wrote: Red Word that prompted transfer to Nurse Triage: Diarrhea, bad cramping, [place] last week for 3 weeks. Has been on immodium since then, still has some left but still has the symptoms. Reason for Disposition  [1] MODERATE diarrhea (e.g., 4-6 times / day more than normal) AND [2] age > 70 years  Travel to a foreign country in past month  Answer Assessment - Initial Assessment Questions 1. DIARRHEA SEVERITY: How bad is the diarrhea? How many more stools have you had in the past 24 hours than normal?      2-3 during 12 hour period  2. ONSET: When did the diarrhea begin?      2 weeks, perisistent  3. STOOL DESCRIPTION:  How loose or watery is the diarrhea? What is the stool color? Is there any blood or mucous in the stool?     Explosive  4. VOMITING: Are you also vomiting? If Yes, ask: How many times in the past 24 hours?      No  5. ABDOMEN PAIN: Are you having any abdomen pain? If Yes, ask: What does it feel like? (e.g., crampy, dull, intermittent, constant)      Abdominal cramping  6. ABDOMEN PAIN SEVERITY: If present, ask: How bad is the pain?  (e.g., Scale 1-10; mild, moderate, or severe)     Moderate  7. ORAL INTAKE: If vomiting, Have you been able to drink liquids? How much liquids have you had in the past 24 hours?     *No Answer* 8. HYDRATION: Any signs of dehydration? (e.g., dry mouth [not just dry lips], too weak to stand, dizziness, new weight loss) When did you last urinate?     No dehydration  9. EXPOSURE: Have you traveled to a foreign country recently? Have you been exposed to anyone with diarrhea? Could you have eaten any food that was spoiled?     Traveled to [place]  10. ANTIBIOTIC USE: Are you taking antibiotics now or have you taken antibiotics in the past 2 months?       Taking augmentin  that was prescribed for mouth sores,  and has had some relief  11. OTHER SYMPTOMS: Do you have any other symptoms? (e.g., fever, blood in stool)       No  12. PREGNANCY: Is there any chance you are pregnant? When was your last menstrual period?       No  Was prescriped Cipro  that was prescribed prior to travel. Then was started on Augmentin .SABRAApply heat to the sinuses to relieve congestion and discomfort. Has had some relief but has been persistent  Protocols used: Diarrhea-A-AH

## 2024-08-01 NOTE — Progress Notes (Signed)
 Name: Deborah Bray   Date of Visit: 08/01/24   Date of last visit with me: Visit date not found   CHIEF COMPLAINT:  Chief Complaint  Patient presents with   Acute Visit    Moderate diarrhea been going on for 2 to 3 weeks.        HPI:  Discussed the use of AI scribe software for clinical note transcription with the patient, who gave verbal consent to proceed.  History of Present Illness   Deborah Bray is a 73 year old female who presents with gastrointestinal symptoms following recent travel to [place].  She has been experiencing gastrointestinal symptoms for the past two weeks, beginning on July 19, 2024, while in the [place]. Her symptoms include diarrhea and a lack of appetite, with a feeling of fullness despite not eating much. The frequency of diarrhea has decreased over the last three to four days, but she experienced a recurrence this morning.  She describes having excruciating abdominal cramps about four days ago, which were not relieved by bowel movements as she did not have any. The cramps were severe and located in the abdominal area. She managed the cramps by taking unspecified medication every four hours, which helped alleviate the pain, but subsequently, the diarrhea returned.  She mentions a history of similar cramps in the past, though she cannot recall the exact timing or cause. She also recalls a previous episode of diarrhea associated with salmonella, which she contracted from eating pizza, not related to travel.  She has been taking antibiotics prescribed by her dentist for a sore in her mouth, which she continued until this morning. She also reports taking probiotics regularly. Her water intake during her trip was about two to three bottles during the day and two more at night.  She has not had a normal bowel movement in two and a half weeks. No bloating is reported. She reports a lack of appetite and feeling of fullness. No current  abdominal pain but had severe cramps four days ago.         OBJECTIVE:       12/23/2021   11:17 AM  Depression screen PHQ 2/9  Decreased Interest 0  Down, Depressed, Hopeless 0  PHQ - 2 Score 0     BP Readings from Last 3 Encounters:  08/01/24 120/78  03/22/24 130/80  09/25/23 124/84    BP 120/78   Pulse 69   Wt 107 lb 6.4 oz (48.7 kg)   SpO2 98%   BMI 19.03 kg/m    Physical Exam          Physical Exam Constitutional:      Appearance: Normal appearance.  Abdominal:     General: Abdomen is flat.     Tenderness: There is no abdominal tenderness.  Neurological:     Mental Status: She is alert.     ASSESSMENT/PLAN:   Assessment & Plan Diarrhea, unspecified type  Generalized abdominal pain    Assessment and Plan    Constipation with associated diarrhea and abdominal cramping Intermittent diarrhea and abdominal cramping post-travel to [place]. Recent antibiotics may have disrupted gut flora. Differential includes constipation-induced diarrhea versus travel-related infection. Constipation remains a strong consideration. - Order abdominal x-ray to assess for constipation. - Provide stool culture container for analysis if needed. - Discontinue antibiotics. - Initiate Miralax  twice daily if x-ray confirms constipation. - Advise to visit [facility] for x-ray. - Follow up with x-ray results for further management.  Daneille Desilva A. Vita MD [facility]

## 2024-08-02 ENCOUNTER — Ambulatory Visit: Payer: Self-pay | Admitting: Family Medicine

## 2024-08-02 ENCOUNTER — Encounter: Payer: Self-pay | Admitting: Family Medicine

## 2024-08-02 MED ORDER — POLYETHYLENE GLYCOL 3350 17 GM/SCOOP PO POWD: 17.0000 g | Freq: Two times a day (BID) | ORAL | 1 refills | Status: AC | PRN

## 2024-08-12 ENCOUNTER — Telehealth: Payer: Self-pay | Admitting: Nurse Practitioner

## 2024-08-12 NOTE — Telephone Encounter (Signed)
 Dropped off form for Dr.Jha to complete because he seen her for her issue pertaining to the form. Was placed in his folder.

## 2024-09-14 DIAGNOSIS — Z85828 Personal history of other malignant neoplasm of skin: Secondary | ICD-10-CM | POA: Diagnosis not present

## 2024-09-14 DIAGNOSIS — D225 Melanocytic nevi of trunk: Secondary | ICD-10-CM | POA: Diagnosis not present

## 2024-09-14 DIAGNOSIS — L570 Actinic keratosis: Secondary | ICD-10-CM | POA: Diagnosis not present

## 2024-09-14 DIAGNOSIS — L821 Other seborrheic keratosis: Secondary | ICD-10-CM | POA: Diagnosis not present

## 2024-09-14 DIAGNOSIS — Z85820 Personal history of malignant melanoma of skin: Secondary | ICD-10-CM | POA: Diagnosis not present

## 2024-09-14 DIAGNOSIS — L814 Other melanin hyperpigmentation: Secondary | ICD-10-CM | POA: Diagnosis not present

## 2024-10-25 DIAGNOSIS — L570 Actinic keratosis: Secondary | ICD-10-CM | POA: Diagnosis not present

## 2024-10-25 DIAGNOSIS — D485 Neoplasm of uncertain behavior of skin: Secondary | ICD-10-CM | POA: Diagnosis not present
